# Patient Record
Sex: Male | Born: 1970 | Hispanic: Yes | Marital: Single | Smoking: Never smoker
Health system: Southern US, Community
[De-identification: ages and names within clinical notes are randomized; demographics above are authoritative.]

## PROBLEM LIST (undated history)

## (undated) DIAGNOSIS — R911 Solitary pulmonary nodule: Secondary | ICD-10-CM

## (undated) DIAGNOSIS — E785 Hyperlipidemia, unspecified: Secondary | ICD-10-CM

## (undated) HISTORY — DX: Hyperlipidemia, unspecified: E78.5

## (undated) HISTORY — DX: Solitary pulmonary nodule: R91.1

---

## 2016-08-21 DIAGNOSIS — H538 Other visual disturbances: Secondary | ICD-10-CM | POA: Insufficient documentation

## 2016-08-21 DIAGNOSIS — I10 Essential (primary) hypertension: Secondary | ICD-10-CM | POA: Insufficient documentation

## 2016-08-21 DIAGNOSIS — R748 Abnormal levels of other serum enzymes: Secondary | ICD-10-CM | POA: Insufficient documentation

## 2016-08-21 DIAGNOSIS — E119 Type 2 diabetes mellitus without complications: Secondary | ICD-10-CM | POA: Insufficient documentation

## 2016-08-21 DIAGNOSIS — E1169 Type 2 diabetes mellitus with other specified complication: Secondary | ICD-10-CM | POA: Insufficient documentation

## 2016-11-21 DIAGNOSIS — R809 Proteinuria, unspecified: Secondary | ICD-10-CM | POA: Insufficient documentation

## 2017-07-05 DIAGNOSIS — E8809 Other disorders of plasma-protein metabolism, not elsewhere classified: Secondary | ICD-10-CM | POA: Insufficient documentation

## 2017-12-27 DIAGNOSIS — D649 Anemia, unspecified: Secondary | ICD-10-CM | POA: Insufficient documentation

## 2018-03-16 DIAGNOSIS — L282 Other prurigo: Secondary | ICD-10-CM | POA: Insufficient documentation

## 2018-12-05 ENCOUNTER — Telehealth: Payer: Self-pay | Admitting: Internal Medicine

## 2018-12-05 ENCOUNTER — Encounter: Payer: Self-pay | Admitting: Internal Medicine

## 2019-11-20 ENCOUNTER — Emergency Department
Admission: EM | Admit: 2019-11-20 | Discharge: 2019-11-20 | Disposition: A | Discharge status: Home / Self Care | Payer: Self-pay | Attending: Emergency Medicine | Admitting: Emergency Medicine

## 2019-11-20 ENCOUNTER — Emergency Department: Payer: Self-pay

## 2019-11-20 ENCOUNTER — Encounter: Payer: Self-pay | Admitting: Radiology

## 2019-11-20 ENCOUNTER — Other Ambulatory Visit: Payer: Self-pay

## 2019-11-20 DIAGNOSIS — J029 Acute pharyngitis, unspecified: Secondary | ICD-10-CM | POA: Insufficient documentation

## 2019-11-20 DIAGNOSIS — R197 Diarrhea, unspecified: Secondary | ICD-10-CM | POA: Insufficient documentation

## 2019-11-20 DIAGNOSIS — R918 Other nonspecific abnormal finding of lung field: Secondary | ICD-10-CM | POA: Insufficient documentation

## 2019-11-20 DIAGNOSIS — R0602 Shortness of breath: Secondary | ICD-10-CM | POA: Insufficient documentation

## 2019-11-20 DIAGNOSIS — R066 Hiccough: Secondary | ICD-10-CM | POA: Insufficient documentation

## 2019-11-20 LAB — CBC WITH DIFFERENTIAL/PLATELET
Abs Immature Granulocytes: 0.24 10*3/uL — ABNORMAL HIGH (ref 0.00–0.07)
Basophils Absolute: 0 10*3/uL (ref 0.0–0.1)
Basophils Relative: 0 %
Eosinophils Absolute: 0 10*3/uL (ref 0.0–0.5)
Eosinophils Relative: 0 %
HCT: 30 % — ABNORMAL LOW (ref 39.0–52.0)
Hemoglobin: 9.8 g/dL — ABNORMAL LOW (ref 13.0–17.0)
Immature Granulocytes: 4 %
Lymphocytes Relative: 41 %
Lymphs Abs: 2.6 10*3/uL (ref 0.7–4.0)
MCH: 27.8 pg (ref 26.0–34.0)
MCHC: 32.7 g/dL (ref 30.0–36.0)
MCV: 85 fL (ref 80.0–100.0)
Monocytes Absolute: 0.9 10*3/uL (ref 0.1–1.0)
Monocytes Relative: 14 %
Neutro Abs: 2.6 10*3/uL (ref 1.7–7.7)
Neutrophils Relative %: 41 %
Platelets: 165 10*3/uL (ref 150–400)
RBC: 3.53 MIL/uL — ABNORMAL LOW (ref 4.22–5.81)
RDW: 14.3 % (ref 11.5–15.5)
WBC: 6.3 10*3/uL (ref 4.0–10.5)
nRBC: 0 % (ref 0.0–0.2)

## 2019-11-20 LAB — COMPREHENSIVE METABOLIC PANEL
ALT: 36 U/L (ref 0–44)
AST: 57 U/L — ABNORMAL HIGH (ref 15–41)
Albumin: 2.2 g/dL — ABNORMAL LOW (ref 3.5–5.0)
Alkaline Phosphatase: 331 U/L — ABNORMAL HIGH (ref 38–126)
Anion gap: 6 (ref 5–15)
BUN: 33 mg/dL — ABNORMAL HIGH (ref 6–20)
CO2: 26 mmol/L (ref 22–32)
Calcium: 7.6 mg/dL — ABNORMAL LOW (ref 8.9–10.3)
Chloride: 100 mmol/L (ref 98–111)
Creatinine, Ser: 1.42 mg/dL — ABNORMAL HIGH (ref 0.61–1.24)
GFR calc Af Amer: >60 mL/min (ref >60)
GFR calc non Af Amer: 57 mL/min — ABNORMAL LOW (ref >60)
Glucose, Bld: 121 mg/dL — ABNORMAL HIGH (ref 70–99)
Potassium: 3.7 mmol/L (ref 3.5–5.1)
Sodium: 132 mmol/L — ABNORMAL LOW (ref 135–145)
Total Bilirubin: 0.6 mg/dL (ref 0.3–1.2)
Total Protein: 9.5 g/dL — ABNORMAL HIGH (ref 6.5–8.1)

## 2019-11-20 MED ORDER — GABAPENTIN 100 MG PO CAPS
100.0000 mg | ORAL_CAPSULE | Freq: Three times a day (TID) | ORAL | 2 refills | Status: AC
Start: 2019-11-20 — End: 2020-11-19

## 2019-11-20 MED ORDER — IOHEXOL 300 MG/ML  SOLN
75.0000 mL | Freq: Once | INTRAMUSCULAR | Status: AC | PRN
  Administered 2019-11-20: 75 mL via INTRAVENOUS

## 2019-11-20 MED ORDER — PANTOPRAZOLE SODIUM 40 MG PO TBEC
40.0000 mg | DELAYED_RELEASE_TABLET | Freq: Every day | ORAL | 1 refills | Status: DC
Start: 2019-11-20 — End: 2019-12-13

## 2019-11-20 MED ORDER — SODIUM CHLORIDE 0.9 % IV BOLUS
1000.0000 mL | Freq: Once | INTRAVENOUS | Status: AC
  Administered 2019-11-20: 1000 mL via INTRAVENOUS

## 2019-11-20 NOTE — ED Notes (Signed)
Topez not working, Pt verbalized understanding discharge instructions with interpreter present.

## 2019-11-20 NOTE — Discharge Instructions (Addendum)
Please follow-up with your primary care doctor.  Have them review the CT scan results.  There are a lot of lymph nodes on the CT scan will have to investigate what is causing this.  I will give you some gabapentin 1 pill 3 times a day to help with the hiccups and some Protonix which should help with the burning in the throat.  Will also help with the reflux.  Please return here for fever vomiting or feeling sicker. Por favor visite a su clinica, y el Dr. Tommi Rumps la clinica tiene que ver las imagenes ( fotos) que muestran la inflamacion de sus ganglios linfaticos. Por favor tomese la medicina como prescrita ( Gabapetin 3 veces por dia, y Protonix para las agruras. Regrese por fiebre, vomito o se siente mal.

## 2019-11-20 NOTE — ED Triage Notes (Signed)
Information obtained via [facility] interpreter.  Reports a week with hiccups and acid reflux which makes him feel short of breath.

## 2019-11-20 NOTE — ED Notes (Signed)
Pt sitting in wheelchair in lobby in no acute distress.  

## 2019-11-20 NOTE — ED Provider Notes (Signed)
[facility] Emergency Department Provider Note   ____________________________________________   First MD Initiated Contact with Patient 11/20/19 0720     (approximate)  I have reviewed the triage vital signs and the nursing notes.   HISTORY  Chief Complaint No chief complaint on file.  Chief complaint is hiccups  HPI Manuel Hayes is a 49 y.o. male who reports he has been having hiccups intermittently since last Monday.  I be a week.  He is hiccuping so much that he is having some pain in his throat.  He is also coughing a little bit.  He has had diarrheal stools for 2 days now.  He is not running a fever.  His cough is nonproductive.  He has no other problems.  He is a diabetic and is seen at a clinic in [place] for this.  Sore throat is slightly burning.  Past medical history significant for diabetes for which he takes Metformin for reflux    Prior to Admission medications   Medication Sig Start Date End Date Taking? Authorizing Provider  gabapentin (NEURONTIN) 100 MG capsule Take 1 capsule (100 mg total) by mouth 3 (three) times daily. 11/20/19 11/19/20  Arnaldo Natal, MD  pantoprazole (PROTONIX) 40 MG tablet Take 1 tablet (40 mg total) by mouth daily. 11/20/19 11/19/20  Arnaldo Natal, MD    Allergies Patient has no known allergies.  History reviewed. No pertinent family history.  Social History Social History   Tobacco Use  . Smoking status: Not on file  Substance Use Topics  . Alcohol use: Not on file  . Drug use: Not on file    Review of Systems  Constitutional: No fever/chills Eyes: No visual changes. ENT: No sore throat. Cardiovascular: Denies chest pain. Respiratory: Denies shortness of breath. Gastrointestinal: No abdominal pain.  No nausea, no vomiting.  Mild diarrhea.  No constipation. Genitourinary: Negative for dysuria. Musculoskeletal: Negative for back pain. Skin: Negative for rash. Neurological: Negative for  headaches, focal weakness   ____________________________________________   PHYSICAL EXAM:  VITAL SIGNS: ED Triage Vitals  Enc Vitals Group     BP 11/20/19 0241 132/77     Pulse Rate 11/20/19 0241 (!) 107     Resp 11/20/19 0241 20     Temp 11/20/19 0241 99.7 F (37.6 C)     Temp Source 11/20/19 0241 Oral     SpO2 11/20/19 0241 100 %     Weight 11/20/19 0238 119 lb (54 kg)     Height 11/20/19 0238 5' (1.524 m)     Head Circumference --      Peak Flow --      Pain Score 11/20/19 0619 8     Pain Loc --      Pain Edu? --      Excl. in GC? --     Constitutional: Alert and oriented. Well appearing and in no acute distress. Eyes: Conjunctivae are normal.  Head: Atraumatic. Nose: No congestion/rhinnorhea. Mouth/Throat: Mucous membranes are moist.  Oropharynx non-erythematous. Neck: No stridor. Cardiovascular: Normal rate, regular rhythm. Grossly normal heart sounds.  Good peripheral circulation. Respiratory: Normal respiratory effort.  No retractions. Lungs CTAB. Gastrointestinal: Soft and nontender. No distention. No abdominal bruits. Musculoskeletal: No lower extremity tenderness nor edema.   Neurologic:  Normal speech and language. No gross focal neurologic deficits are appreciated.  Skin:  Skin is warm, dry and intact. No rash noted.   ____________________________________________   LABS (all labs ordered are listed, but only  abnormal results are displayed)  Labs Reviewed  CBC WITH DIFFERENTIAL/PLATELET - Abnormal; Notable for the following components:      Result Value   RBC 3.53 (*)    Hemoglobin 9.8 (*)    HCT 30.0 (*)    Abs Immature Granulocytes 0.24 (*)    All other components within normal limits  COMPREHENSIVE METABOLIC PANEL - Abnormal; Notable for the following components:   Sodium 132 (*)    Glucose, Bld 121 (*)    BUN 33 (*)    Creatinine, Ser 1.42 (*)    Calcium 7.6 (*)    Total Protein 9.5 (*)    Albumin 2.2 (*)    AST 57 (*)    Alkaline  Phosphatase 331 (*)    GFR calc non Af Amer 57 (*)    All other components within normal limits   ____________________________________________  EKG   ____________________________________________  RADIOLOGY  ED MD interpretation: Chest x-ray read by radiology as having a spot on the lung on the left base which is not seen on the lateral view CT scan of the chest shows multiple apparent pulmonary nodules and lymph nodes.  I reviewed the films Official radiology report(s): DG Chest 2 View  Result Date: 11/20/2019 CLINICAL DATA:  49 year old male with history of cough. EXAM: CHEST - 2 VIEW COMPARISON:  Chest x-ray 11/20/2019. FINDINGS: Nodular density projecting over the left lung base seen only on the frontal projection. Prominent right-sided nipple shadow incidentally noted. Lung volumes are normal. No consolidative airspace disease. No pleural effusions. No pneumothorax. Pulmonary vasculature and the cardiomediastinal silhouette are within normal limits. IMPRESSION: 1.  No radiographic evidence of acute cardiopulmonary disease. 2. Nodular density projecting over the left lung base noted only on the frontal projection. Repeat standing PA and lateral chest radiograph is recommended in 1-2 months to ensure the stability or resolution of this finding. Should this finding persists, further evaluation with chest CT would be recommended. Electronically Signed   By: Trudie Reedaniel  Entrikin M.D.   On: 11/20/2019 06:38   CT Chest W Contrast  Result Date: 11/20/2019 CLINICAL DATA:  49 year old male with history of elevated alkaline phosphatase. Shortness of breath. Hiccups. EXAM: CT CHEST, ABDOMEN, AND PELVIS WITH CONTRAST TECHNIQUE: Multidetector CT imaging of the chest, abdomen and pelvis was performed following the standard protocol during bolus administration of intravenous contrast. CONTRAST:  75mL OMNIPAQUE IOHEXOL 300 MG/ML  SOLN COMPARISON:  No priors. FINDINGS: CT CHEST FINDINGS Cardiovascular: Heart size  is normal. There is no significant pericardial fluid, thickening or pericardial calcification. Aortic atherosclerosis. No definite coronary artery calcifications. Mediastinum/Nodes: Multiple prominent borderline enlarged mediastinal and bilateral hilar lymph nodes are noted, nonspecific. Esophagus is unremarkable in appearance. Multiple borderline enlarged left axillary lymph nodes also noted measuring up to 8 mm in short axis. Lungs/Pleura: Multiple pulmonary nodules are noted throughout the lungs bilaterally, largest of which is in the inferior segment of the lingula measuring 2.1 x 0.8 cm (axial image 103 of series 4). In addition, there are innumerable tiny nodules noted in a subpleural distribution associated with the major and minor fissures. No acute consolidative airspace disease. No pleural effusions. Musculoskeletal: There are no aggressive appearing lytic or blastic lesions noted in the visualized portions of the skeleton. CT ABDOMEN PELVIS FINDINGS Hepatobiliary: No suspicious cystic or solid hepatic lesions. No intra or extrahepatic biliary ductal dilatation. Gallbladder is normal in appearance. Pancreas: No pancreatic mass. No pancreatic ductal dilatation. No pancreatic or peripancreatic fluid collections or inflammatory changes. Spleen: Unremarkable.  Adrenals/Urinary Tract: Bilateral kidneys and bilateral adrenal glands are normal in appearance. No hydroureteronephrosis. Urinary bladder is normal in appearance. Stomach/Bowel: Normal appearance of the stomach. No pathologic dilatation of small bowel or colon. Normal appendix. Vascular/Lymphatic: Aortic atherosclerosis, without evidence of aneurysm or dissection in the abdominal or pelvic vasculature. No pathologically enlarged abdominal or pelvic lymph nodes are noted. Borderline enlarged right common femoral lymph node incidentally noted measuring 9 mm in short axis. Reproductive: Prostate gland and seminal vesicles are unremarkable in appearance.  Other: No significant volume of ascites.  No pneumoperitoneum. Musculoskeletal: Well-defined sclerotic lesion with narrow zone of transition in the medial aspect of the left ilium adjacent to the sacroiliac joint measuring 1.3 x 0.8 cm, most compatible with a bone island. There are no aggressive appearing lytic or blastic lesions noted in the visualized portions of the skeleton. IMPRESSION: 1. No acute findings are noted in the chest, abdomen or pelvis to account for the patient's symptoms. 2. However, there are innumerable by borderline enlarged mediastinal, bilateral hilar and left axillary lymph nodes. This is nonspecific. However, in light of the widespread subpleural nodularity in the lungs, clinical correlation for signs and symptoms of systemic disease such as sarcoidosis is recommended. 3. There also other larger pulmonary nodules in the lungs bilaterally measuring up to 2.1 x 0.8 cm in the lingula. If the patient does have sarcoidosis, these could be related. However, close attention on follow-up studies is recommended. Non-contrast chest CT at 3-6 months is recommended. If the nodules are stable at time of repeat CT, then future CT at 18-24 months (from today's scan) is considered optional for low-risk patients, but is recommended for high-risk patients. This recommendation follows the consensus statement: Guidelines for Management of Incidental Pulmonary Nodules Detected on CT Images: From the Fleischner Society 2017; Radiology 2017; 284:228-243. 4. Additional incidental findings, as above. Electronically Signed   By: Trudie Reed M.D.   On: 11/20/2019 09:30   CT ABDOMEN PELVIS W CONTRAST  Result Date: 11/20/2019 CLINICAL DATA:  49 year old male with history of elevated alkaline phosphatase. Shortness of breath. Hiccups. EXAM: CT CHEST, ABDOMEN, AND PELVIS WITH CONTRAST TECHNIQUE: Multidetector CT imaging of the chest, abdomen and pelvis was performed following the standard protocol during bolus  administration of intravenous contrast. CONTRAST:  55mL OMNIPAQUE IOHEXOL 300 MG/ML  SOLN COMPARISON:  No priors. FINDINGS: CT CHEST FINDINGS Cardiovascular: Heart size is normal. There is no significant pericardial fluid, thickening or pericardial calcification. Aortic atherosclerosis. No definite coronary artery calcifications. Mediastinum/Nodes: Multiple prominent borderline enlarged mediastinal and bilateral hilar lymph nodes are noted, nonspecific. Esophagus is unremarkable in appearance. Multiple borderline enlarged left axillary lymph nodes also noted measuring up to 8 mm in short axis. Lungs/Pleura: Multiple pulmonary nodules are noted throughout the lungs bilaterally, largest of which is in the inferior segment of the lingula measuring 2.1 x 0.8 cm (axial image 103 of series 4). In addition, there are innumerable tiny nodules noted in a subpleural distribution associated with the major and minor fissures. No acute consolidative airspace disease. No pleural effusions. Musculoskeletal: There are no aggressive appearing lytic or blastic lesions noted in the visualized portions of the skeleton. CT ABDOMEN PELVIS FINDINGS Hepatobiliary: No suspicious cystic or solid hepatic lesions. No intra or extrahepatic biliary ductal dilatation. Gallbladder is normal in appearance. Pancreas: No pancreatic mass. No pancreatic ductal dilatation. No pancreatic or peripancreatic fluid collections or inflammatory changes. Spleen: Unremarkable. Adrenals/Urinary Tract: Bilateral kidneys and bilateral adrenal glands are normal in appearance. No hydroureteronephrosis. Urinary bladder  is normal in appearance. Stomach/Bowel: Normal appearance of the stomach. No pathologic dilatation of small bowel or colon. Normal appendix. Vascular/Lymphatic: Aortic atherosclerosis, without evidence of aneurysm or dissection in the abdominal or pelvic vasculature. No pathologically enlarged abdominal or pelvic lymph nodes are noted. Borderline  enlarged right common femoral lymph node incidentally noted measuring 9 mm in short axis. Reproductive: Prostate gland and seminal vesicles are unremarkable in appearance. Other: No significant volume of ascites.  No pneumoperitoneum. Musculoskeletal: Well-defined sclerotic lesion with narrow zone of transition in the medial aspect of the left ilium adjacent to the sacroiliac joint measuring 1.3 x 0.8 cm, most compatible with a bone island. There are no aggressive appearing lytic or blastic lesions noted in the visualized portions of the skeleton. IMPRESSION: 1. No acute findings are noted in the chest, abdomen or pelvis to account for the patient's symptoms. 2. However, there are innumerable by borderline enlarged mediastinal, bilateral hilar and left axillary lymph nodes. This is nonspecific. However, in light of the widespread subpleural nodularity in the lungs, clinical correlation for signs and symptoms of systemic disease such as sarcoidosis is recommended. 3. There also other larger pulmonary nodules in the lungs bilaterally measuring up to 2.1 x 0.8 cm in the lingula. If the patient does have sarcoidosis, these could be related. However, close attention on follow-up studies is recommended. Non-contrast chest CT at 3-6 months is recommended. If the nodules are stable at time of repeat CT, then future CT at 18-24 months (from today's scan) is considered optional for low-risk patients, but is recommended for high-risk patients. This recommendation follows the consensus statement: Guidelines for Management of Incidental Pulmonary Nodules Detected on CT Images: From the Fleischner Society 2017; Radiology 2017; 284:228-243. 4. Additional incidental findings, as above. Electronically Signed   By: Trudie Reed M.D.   On: 11/20/2019 09:30    ____________________________________________   PROCEDURES  Procedure(s) performed (including Critical  Care):  Procedures   ____________________________________________   INITIAL IMPRESSION / ASSESSMENT AND PLAN / ED COURSE  Patient has a spot on his lung on the AP view only.  Additionally he has an elevated alkaline phosphatase and minimally elevated AST.  His protein slightly up and his calcium is down.    Chest CT shows multiple pulmonary nodules and lymph nodes.  Radiologist is concerned for sarcoid.  I will have patient follow-up with his primary care doctor to arrange for the follow-up and evaluation.  I will try gabapentin for his hiccups as is recommended by up-to-date.          ____________________________________________   FINAL CLINICAL IMPRESSION(S) / ED DIAGNOSES  Final diagnoses:  Intractable hiccups  Lung nodule, multiple     ED Discharge Orders         Ordered    pantoprazole (PROTONIX) 40 MG tablet  Daily     Discontinue  Reprint     11/20/19 1123    gabapentin (NEURONTIN) 100 MG capsule  3 times daily     Discontinue  Reprint     11/20/19 1123           Note:  This document was prepared using Dragon voice recognition software and may include unintentional dictation errors.    Arnaldo Natal, MD 11/20/19 [phone]

## 2019-11-20 NOTE — ED Notes (Signed)
(  via interpretor) Pt indicates pain in his neck/throat/upper chest region.  He states he has been hiccupping, and yesterday around noon it became so bad that he was having some trouble breathing.  Currently his pain has decreased to 8/10.  Pt denies breathing issues at this time and states the pain has "calmed down".

## 2019-12-13 ENCOUNTER — Other Ambulatory Visit: Payer: Self-pay

## 2019-12-13 ENCOUNTER — Encounter: Payer: Self-pay | Admitting: Pulmonary Disease

## 2019-12-13 ENCOUNTER — Ambulatory Visit (INDEPENDENT_AMBULATORY_CARE_PROVIDER_SITE_OTHER): Payer: Self-pay | Admitting: Pulmonary Disease

## 2019-12-13 VITALS — BP 118/58 | HR 112 | Temp 98.4°F | Ht 62.0 in | Wt 121.0 lb

## 2019-12-13 DIAGNOSIS — R918 Other nonspecific abnormal finding of lung field: Secondary | ICD-10-CM

## 2019-12-13 DIAGNOSIS — D6489 Other specified anemias: Secondary | ICD-10-CM

## 2019-12-13 DIAGNOSIS — R748 Abnormal levels of other serum enzymes: Secondary | ICD-10-CM

## 2019-12-13 MED ORDER — PANTOPRAZOLE SODIUM 40 MG PO TBEC: 40.0000 mg | DELAYED_RELEASE_TABLET | Freq: Every day | ORAL | 1 refills | Status: DC

## 2019-12-13 MED ORDER — OMEPRAZOLE 20 MG PO CPDR: 20.0000 mg | DELAYED_RELEASE_CAPSULE | Freq: Every day | ORAL | 11 refills | Status: DC

## 2019-12-13 NOTE — Progress Notes (Signed)
Patient ID: Manuel Hayes, male    DOB: 15-Jul-1970, 49 y.o.   MRN: [id number]  Chief Complaint  Patient presents with  . Hospitalization Follow-up    lung nodule - hiccups 2 weeks - cough productive a little thick white     Referring provider: Karel Jarvis, NP  HPI:  Manuel Hayes is a 49 year old with DM whom we are seeing in consultation at the request of Michail Jewels NP for evaluation of lung nodules.  Patient had hiccups for 4 days without any relief. This prompted ED visit where CT chest with my interpretation as bilateral primarily upper lobe nodules with largest 2 cm in lingula and bilateral hilar and mediastinal lymph nodes seen. He reports cough present the last few months. Clear phlegm. No clear triggers or alleviating factors. Not worse at night. No clear timing or pattern during the day. Associated with sore throat when he coughs. No DOE or SOB or chest pain.  No recent weight loss. Has lost 25 pounds over last several years after diagnosis of DM. No night sweats or fever. No headaches or muscle weakness.  He is a never smoker. No history of lung cancer in the family.  Review of PCP note and labs drawn in 08/2019 with anemia Hgb 11s and Cr 1.02. Noted to have hypoalbuminemia and elevated total protein. Labs 11/2019 with Cr 1.4, hgb 9.8, total protein 9.5, albumin 2.2.   PMH: DM, HTN Past surgical History: History reviewed. No pertinent surgical history.  Family History:History reviewed. No pertinent family history.  Social History: Lives in [place], never smoker, works as Curator  Imaging: Personally Reviewed DG Chest 2 View  Result Date: 11/20/2019 CLINICAL DATA:  49 year old male with history of cough. EXAM: CHEST - 2 VIEW COMPARISON:  Chest x-ray 11/20/2019. FINDINGS: Nodular density projecting over the left lung base seen only on the frontal projection. Prominent right-sided nipple shadow incidentally noted. Lung volumes are normal. No  consolidative airspace disease. No pleural effusions. No pneumothorax. Pulmonary vasculature and the cardiomediastinal silhouette are within normal limits. IMPRESSION: 1.  No radiographic evidence of acute cardiopulmonary disease. 2. Nodular density projecting over the left lung base noted only on the frontal projection. Repeat standing PA and lateral chest radiograph is recommended in 1-2 months to ensure the stability or resolution of this finding. Should this finding persists, further evaluation with chest CT would be recommended. Electronically Signed   By: Vinnie Langton M.D.   On: 11/20/2019 06:38   CT Chest W Contrast  Result Date: 11/20/2019 CLINICAL DATA:  49 year old male with history of elevated alkaline phosphatase. Shortness of breath. Hiccups. EXAM: CT CHEST, ABDOMEN, AND PELVIS WITH CONTRAST TECHNIQUE: Multidetector CT imaging of the chest, abdomen and pelvis was performed following the standard protocol during bolus administration of intravenous contrast. CONTRAST:  35m OMNIPAQUE IOHEXOL 300 MG/ML  SOLN COMPARISON:  No priors. FINDINGS: CT CHEST FINDINGS Cardiovascular: Heart size is normal. There is no significant pericardial fluid, thickening or pericardial calcification. Aortic atherosclerosis. No definite coronary artery calcifications. Mediastinum/Nodes: Multiple prominent borderline enlarged mediastinal and bilateral hilar lymph nodes are noted, nonspecific. Esophagus is unremarkable in appearance. Multiple borderline enlarged left axillary lymph nodes also noted measuring up to 8 mm in short axis. Lungs/Pleura: Multiple pulmonary nodules are noted throughout the lungs bilaterally, largest of which is in the inferior segment of the lingula measuring 2.1 x 0.8 cm (axial image 103 of series 4). In addition, there are innumerable tiny nodules noted in a  subpleural distribution associated with the major and minor fissures. No acute consolidative airspace disease. No pleural effusions.  Musculoskeletal: There are no aggressive appearing lytic or blastic lesions noted in the visualized portions of the skeleton. CT ABDOMEN PELVIS FINDINGS Hepatobiliary: No suspicious cystic or solid hepatic lesions. No intra or extrahepatic biliary ductal dilatation. Gallbladder is normal in appearance. Pancreas: No pancreatic mass. No pancreatic ductal dilatation. No pancreatic or peripancreatic fluid collections or inflammatory changes. Spleen: Unremarkable. Adrenals/Urinary Tract: Bilateral kidneys and bilateral adrenal glands are normal in appearance. No hydroureteronephrosis. Urinary bladder is normal in appearance. Stomach/Bowel: Normal appearance of the stomach. No pathologic dilatation of small bowel or colon. Normal appendix. Vascular/Lymphatic: Aortic atherosclerosis, without evidence of aneurysm or dissection in the abdominal or pelvic vasculature. No pathologically enlarged abdominal or pelvic lymph nodes are noted. Borderline enlarged right common femoral lymph node incidentally noted measuring 9 mm in short axis. Reproductive: Prostate gland and seminal vesicles are unremarkable in appearance. Other: No significant volume of ascites.  No pneumoperitoneum. Musculoskeletal: Well-defined sclerotic lesion with narrow zone of transition in the medial aspect of the left ilium adjacent to the sacroiliac joint measuring 1.3 x 0.8 cm, most compatible with a bone island. There are no aggressive appearing lytic or blastic lesions noted in the visualized portions of the skeleton. IMPRESSION: 1. No acute findings are noted in the chest, abdomen or pelvis to account for the patient's symptoms. 2. However, there are innumerable by borderline enlarged mediastinal, bilateral hilar and left axillary lymph nodes. This is nonspecific. However, in light of the widespread subpleural nodularity in the lungs, clinical correlation for signs and symptoms of systemic disease such as sarcoidosis is recommended. 3. There also other  larger pulmonary nodules in the lungs bilaterally measuring up to 2.1 x 0.8 cm in the lingula. If the patient does have sarcoidosis, these could be related. However, close attention on follow-up studies is recommended. Non-contrast chest CT at 3-6 months is recommended. If the nodules are stable at time of repeat CT, then future CT at 18-24 months (from today's scan) is considered optional for low-risk patients, but is recommended for high-risk patients. This recommendation follows the consensus statement: Guidelines for Management of Incidental Pulmonary Nodules Detected on CT Images: From the Fleischner Society 2017; Radiology 2017; 284:228-243. 4. Additional incidental findings, as above. Electronically Signed   By: Vinnie Langton M.D.   On: 11/20/2019 09:30   CT ABDOMEN PELVIS W CONTRAST  Result Date: 11/20/2019 CLINICAL DATA:  49 year old male with history of elevated alkaline phosphatase. Shortness of breath. Hiccups. EXAM: CT CHEST, ABDOMEN, AND PELVIS WITH CONTRAST TECHNIQUE: Multidetector CT imaging of the chest, abdomen and pelvis was performed following the standard protocol during bolus administration of intravenous contrast. CONTRAST:  4m OMNIPAQUE IOHEXOL 300 MG/ML  SOLN COMPARISON:  No priors. FINDINGS: CT CHEST FINDINGS Cardiovascular: Heart size is normal. There is no significant pericardial fluid, thickening or pericardial calcification. Aortic atherosclerosis. No definite coronary artery calcifications. Mediastinum/Nodes: Multiple prominent borderline enlarged mediastinal and bilateral hilar lymph nodes are noted, nonspecific. Esophagus is unremarkable in appearance. Multiple borderline enlarged left axillary lymph nodes also noted measuring up to 8 mm in short axis. Lungs/Pleura: Multiple pulmonary nodules are noted throughout the lungs bilaterally, largest of which is in the inferior segment of the lingula measuring 2.1 x 0.8 cm (axial image 103 of series 4). In addition, there are  innumerable tiny nodules noted in a subpleural distribution associated with the major and minor fissures. No acute consolidative airspace disease. No  pleural effusions. Musculoskeletal: There are no aggressive appearing lytic or blastic lesions noted in the visualized portions of the skeleton. CT ABDOMEN PELVIS FINDINGS Hepatobiliary: No suspicious cystic or solid hepatic lesions. No intra or extrahepatic biliary ductal dilatation. Gallbladder is normal in appearance. Pancreas: No pancreatic mass. No pancreatic ductal dilatation. No pancreatic or peripancreatic fluid collections or inflammatory changes. Spleen: Unremarkable. Adrenals/Urinary Tract: Bilateral kidneys and bilateral adrenal glands are normal in appearance. No hydroureteronephrosis. Urinary bladder is normal in appearance. Stomach/Bowel: Normal appearance of the stomach. No pathologic dilatation of small bowel or colon. Normal appendix. Vascular/Lymphatic: Aortic atherosclerosis, without evidence of aneurysm or dissection in the abdominal or pelvic vasculature. No pathologically enlarged abdominal or pelvic lymph nodes are noted. Borderline enlarged right common femoral lymph node incidentally noted measuring 9 mm in short axis. Reproductive: Prostate gland and seminal vesicles are unremarkable in appearance. Other: No significant volume of ascites.  No pneumoperitoneum. Musculoskeletal: Well-defined sclerotic lesion with narrow zone of transition in the medial aspect of the left ilium adjacent to the sacroiliac joint measuring 1.3 x 0.8 cm, most compatible with a bone island. There are no aggressive appearing lytic or blastic lesions noted in the visualized portions of the skeleton. IMPRESSION: 1. No acute findings are noted in the chest, abdomen or pelvis to account for the patient's symptoms. 2. However, there are innumerable by borderline enlarged mediastinal, bilateral hilar and left axillary lymph nodes. This is nonspecific. However, in light of  the widespread subpleural nodularity in the lungs, clinical correlation for signs and symptoms of systemic disease such as sarcoidosis is recommended. 3. There also other larger pulmonary nodules in the lungs bilaterally measuring up to 2.1 x 0.8 cm in the lingula. If the patient does have sarcoidosis, these could be related. However, close attention on follow-up studies is recommended. Non-contrast chest CT at 3-6 months is recommended. If the nodules are stable at time of repeat CT, then future CT at 18-24 months (from today's scan) is considered optional for low-risk patients, but is recommended for high-risk patients. This recommendation follows the consensus statement: Guidelines for Management of Incidental Pulmonary Nodules Detected on CT Images: From the Fleischner Society 2017; Radiology 2017; 284:228-243. 4. Additional incidental findings, as above. Electronically Signed   By: Vinnie Langton M.D.   On: 11/20/2019 09:30    Lab Results: Personally Reviewed, pertinent values noted in HPI CBC    Component Value Date/Time   WBC 6.3 11/20/2019 0246   RBC 3.53 (L) 11/20/2019 0246   HGB 9.8 (L) 11/20/2019 0246   HCT 30.0 (L) 11/20/2019 0246   PLT 165 11/20/2019 0246   MCV 85.0 11/20/2019 0246   MCH 27.8 11/20/2019 0246   MCHC 32.7 11/20/2019 0246   RDW 14.3 11/20/2019 0246   LYMPHSABS 2.6 11/20/2019 0246   MONOABS 0.9 11/20/2019 0246   EOSABS 0.0 11/20/2019 0246   BASOSABS 0.0 11/20/2019 0246    BMET    Component Value Date/Time   NA 132 (L) 11/20/2019 0246   K 3.7 11/20/2019 0246   CL 100 11/20/2019 0246   CO2 26 11/20/2019 0246   GLUCOSE 121 (H) 11/20/2019 0246   BUN 33 (H) 11/20/2019 0246   CREATININE 1.42 (H) 11/20/2019 0246   CALCIUM 7.6 (L) 11/20/2019 0246   GFRNONAA 57 (L) 11/20/2019 0246   GFRAA >60 11/20/2019 0246    BNP No results found for: BNP  ProBNP No results found for: PROBNP   No Known Allergies   There is no immunization history on  file for this  patient.  Past Medical History:  Diagnosis Date  . Hyperlipidemia   . Pulmonary nodule     Tobacco History: Social History   Tobacco Use  Smoking Status Never Smoker  Smokeless Tobacco Never Used   Counseling given: Not Answered   Outpatient Encounter Medications as of 12/13/2019  Medication Sig  . gabapentin (NEURONTIN) 100 MG capsule Take 1 capsule (100 mg total) by mouth 3 (three) times daily.  [name] Kitchen glipiZIDE (GLUCOTROL XL) 2.5 MG 24 hr tablet glipizide ER 2.5 mg tablet, extended release 24 hr  TAKE ONE TABLET BY MOUTH EVERY DAY  . levothyroxine (SYNTHROID) 25 MCG tablet TOMA UNA TABLETA POR LA BOCA DIARIA ANTES DEL DESAYUNO  . lisinopril (ZESTRIL) 5 MG tablet lisinopril 5 mg tablet  TAKE ONE TABLET BY MOUTH EVERY DAY  . lovastatin (MEVACOR) 20 MG tablet lovastatin 20 mg tablet  TAKE ONE TABLET BY MOUTH AT BEDTIME  . metFORMIN (GLUCOPHAGE) 1000 MG tablet Take 1,000 mg by mouth 2 (two) times daily.  . pantoprazole (PROTONIX) 40 MG tablet Take 1 tablet (40 mg total) by mouth daily. (Patient not taking: Reported on 12/13/2019)   No facility-administered encounter medications on file as of 12/13/2019.     Review of Systems  Review of Systems  Scant hemoptysis few weeks ago, none recent. Frequent reflux symptoms. Comprehensive revise of systems otherwise negative  Physical Exam  BP (!) 118/58 (BP Location: Right Arm, Cuff Size: Normal)   Pulse (!) 112   Temp 98.4 F (36.9 C) (Oral)   Ht 5' 2" (1.575 m)   Wt 121 lb (54.9 kg)   SpO2 97%   BMI 22.13 kg/m   Wt Readings from Last 5 Encounters:  12/13/19 121 lb (54.9 kg)  11/20/19 119 lb (54 kg)    BMI Readings from Last 5 Encounters:  12/13/19 22.13 kg/m  11/20/19 23.24 kg/m     Physical Exam General: Thin, in NAD Eyes: EOMI, no icterus Mouth: MMM, lips CDI Respiratory: Mild expiratory crackles R mid lung field, otherwise CTAB, NWOB on RA Cardiovascular: RRR, no murmurs Abdomen: soft, NT, BS present Skin: No  rash, no lesions, warm, dry Neuro: normal gait, CN intact Psych: normal mood, full affect   Assessment & Plan:   Manuel Hayes is a 49 year old with DM whom we are seeing in consultation at the request of Michail Jewels NP for evaluation of lung nodules.  Multiple bilateral upper lobe predominant lung nodules. Differential includes TB, malignancy, vasculitis, sarcoid, and other non-infectious inflammatory conditions. No identifiable risk factors for lung cancer and pattern of nodules with the appearance of LAD would not be typical. Bilateral LAD favors sarcoid over vasculitis. Elevated LFTs could reflect sarcoid involvement. 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 Will obtain serologic testing for vasculitis, inflammatory conditions, and sarcoid today. Will await results. Assuming quant gold is negative and vasculitis serologies ar negative, will move forward with EBUS TBNA to obtain pathologic diagnosis.  He has chronic cough. Could be related to sarcoidosis considered above. Significant GERD possibly contributing. Encouraged take previously prescribed pantoprazole daily and assess cough's response to this therapy.   Finally, review of labs show worsening anemia, rising Cr, and significant protein gap. SPEP obtained to evaluate multiple myeloma.    Return in about 3 months (around 03/14/2020).   Lanier Clam, MD 12/13/2019

## 2019-12-13 NOTE — Patient Instructions (Addendum)
Nice to meet you!  We will get a bunch of labs today to further evaluate the spots in your lungs.   Based on the results, we will either schedule a bronchoscopy with biopsy or repeat a CT scan in 3 months to see if the spots change.  I will call you to discuss in the next couple of weeks.  Take omeprazole 20 mg once daily for reflux. This may help your cough.  Return to clinic in 3 months with Dr. Judeth Horn with PFTs

## 2019-12-14 ENCOUNTER — Other Ambulatory Visit (INDEPENDENT_AMBULATORY_CARE_PROVIDER_SITE_OTHER): Payer: Self-pay

## 2019-12-14 DIAGNOSIS — R918 Other nonspecific abnormal finding of lung field: Secondary | ICD-10-CM

## 2019-12-14 DIAGNOSIS — D6489 Other specified anemias: Secondary | ICD-10-CM

## 2019-12-14 LAB — URINALYSIS, ROUTINE W REFLEX MICROSCOPIC
Bilirubin Urine: NEGATIVE
Ketones, ur: NEGATIVE
Leukocytes,Ua: NEGATIVE
Nitrite: NEGATIVE
Specific Gravity, Urine: 1.01 (ref 1.000–1.030)
Total Protein, Urine: 30 — AB
Urine Glucose: NEGATIVE
Urobilinogen, UA: 0.2 (ref 0.0–1.0)
WBC, UA: NONE SEEN (ref 0–?)
pH: 7.5 (ref 5.0–8.0)

## 2019-12-14 LAB — C-REACTIVE PROTEIN: CRP: 1.1 mg/dL (ref 0.5–20.0)

## 2019-12-14 LAB — SEDIMENTATION RATE: Sed Rate: 96 mm/hr — ABNORMAL HIGH (ref 0–20)

## 2019-12-14 LAB — VITAMIN D 25 HYDROXY (VIT D DEFICIENCY, FRACTURES): VITD: 15.62 ng/mL — ABNORMAL LOW (ref 30.00–100.00)

## 2019-12-16 LAB — ANA+ENA+DNA/DS+ANTICH+CENTR
ANA Titer 1: NEGATIVE
Anti JO-1: <0.2 AI (ref 0.0–0.9)
Centromere Ab Screen: <0.2 AI (ref 0.0–0.9)
Chromatin Ab SerPl-aCnc: <0.2 AI (ref 0.0–0.9)
ENA RNP Ab: 0.4 AI (ref 0.0–0.9)
ENA SM Ab Ser-aCnc: <0.2 AI (ref 0.0–0.9)
ENA SSA (RO) Ab: <0.2 AI (ref 0.0–0.9)
ENA SSB (LA) Ab: <0.2 AI (ref 0.0–0.9)
Scleroderma (Scl-70) (ENA) Antibody, IgG: <0.2 AI (ref 0.0–0.9)
dsDNA Ab: <1 IU/mL (ref 0–9)

## 2019-12-18 ENCOUNTER — Telehealth: Payer: Self-pay | Admitting: Pulmonary Disease

## 2019-12-18 NOTE — Telephone Encounter (Signed)
Called patient to relay results of tests. 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 Sed rate high at 96. But more concerning was a positive quantiferon gold test. 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 This was shared with the patient and informed him I would contact the health department which he understood.  Spoke with [facility] Department and will provide test results, imaging reports, consult note, and demographics so they can contact patient for ongoing care.

## 2019-12-18 NOTE — Telephone Encounter (Signed)
Pt demographics, office note, lab, CXR and CT results were faxed to [facility]. Nothing further needed at this time.

## 2019-12-19 LAB — QUANTIFERON-TB GOLD PLUS
Mitogen-NIL: 8.98 IU/mL
NIL: 0.24 IU/mL
QuantiFERON-TB Gold Plus: POSITIVE — AB
TB1-NIL: 0.54 IU/mL
TB2-NIL: 0.6 IU/mL

## 2019-12-19 LAB — RHEUMATOID FACTOR: Rheumatoid fact SerPl-aCnc: <14 IU/mL (ref <14)

## 2019-12-19 LAB — GLOMERULAR BASEMENT MEMBRANE ANTIBODIES: GBM Ab: <1 AI

## 2019-12-19 LAB — ANCA SCREEN W REFLEX TITER
ANCA Screen: POSITIVE — AB
C-ANCA Titer: 1:20 {titer} — ABNORMAL HIGH

## 2019-12-19 LAB — VITAMIN D 1,25 DIHYDROXY
Vitamin D 1, 25 (OH)2 Total: 26 pg/mL (ref 18–72)
Vitamin D2 1, 25 (OH)2: <8 pg/mL
Vitamin D3 1, 25 (OH)2: 26 pg/mL

## 2019-12-19 LAB — CYCLIC CITRUL PEPTIDE ANTIBODY, IGG: Cyclic Citrullin Peptide Ab: <16 UNITS

## 2019-12-19 LAB — PROTEIN ELECTROPHORESIS, SERUM
Albumin ELP: 2.5 g/dL — ABNORMAL LOW (ref 3.8–4.8)
Alpha 1: 0.3 g/dL (ref 0.2–0.3)
Alpha 2: 0.8 g/dL (ref 0.5–0.9)
Beta 2: 0.7 g/dL — ABNORMAL HIGH (ref 0.2–0.5)
Beta Globulin: 0.5 g/dL (ref 0.4–0.6)
Gamma Globulin: 5.1 g/dL — ABNORMAL HIGH (ref 0.8–1.7)
Total Protein: 9.9 g/dL — ABNORMAL HIGH (ref 6.1–8.1)

## 2019-12-19 LAB — ANGIOTENSIN CONVERTING ENZYME: Angiotensin-Converting Enzyme: <5 U/L — ABNORMAL LOW (ref 9–67)

## 2019-12-28 ENCOUNTER — Telehealth: Payer: Self-pay | Admitting: Pulmonary Disease

## 2019-12-28 NOTE — Telephone Encounter (Signed)
[facility] calling to report sputum smear is negative for TB. Patient has been taken off isolation. FYI to MD Hunsucker. Patient has PFT and OV on 01/04/2020.

## 2020-01-01 ENCOUNTER — Other Ambulatory Visit (HOSPITAL_COMMUNITY)
Admission: RE | Admit: 2020-01-01 | Discharge: 2020-01-01 | Disposition: A | Discharge status: Home / Self Care | Payer: HRSA Program | Source: Ambulatory Visit | Attending: Adult Health | Admitting: Adult Health

## 2020-01-01 DIAGNOSIS — Z20822 Contact with and (suspected) exposure to covid-19: Secondary | ICD-10-CM | POA: Insufficient documentation

## 2020-01-01 DIAGNOSIS — Z01812 Encounter for preprocedural laboratory examination: Secondary | ICD-10-CM | POA: Insufficient documentation

## 2020-01-01 LAB — SARS CORONAVIRUS 2 (TAT 6-24 HRS): SARS Coronavirus 2: NEGATIVE

## 2020-01-04 ENCOUNTER — Ambulatory Visit (INDEPENDENT_AMBULATORY_CARE_PROVIDER_SITE_OTHER): Payer: Self-pay | Admitting: Adult Health

## 2020-01-04 ENCOUNTER — Ambulatory Visit (INDEPENDENT_AMBULATORY_CARE_PROVIDER_SITE_OTHER): Payer: Self-pay | Admitting: Pulmonary Disease

## 2020-01-04 ENCOUNTER — Other Ambulatory Visit: Payer: Self-pay

## 2020-01-04 ENCOUNTER — Encounter: Payer: Self-pay | Admitting: Adult Health

## 2020-01-04 DIAGNOSIS — R918 Other nonspecific abnormal finding of lung field: Secondary | ICD-10-CM | POA: Insufficient documentation

## 2020-01-04 LAB — PULMONARY FUNCTION TEST
DL/VA % pred: 67 %
DL/VA: 3.09 ml/min/mmHg/L
DLCO cor % pred: 64 %
DLCO cor: 13.93 ml/min/mmHg
DLCO unc % pred: 53 %
DLCO unc: 11.59 ml/min/mmHg
FEF 25-75 Post: 3.99 L/sec
FEF 25-75 Pre: 4.11 L/sec
FEF2575-%Change-Post: -2 %
FEF2575-%Pred-Post: 151 %
FEF2575-%Pred-Pre: 155 %
FEV1-%Change-Post: 0 %
FEV1-%Pred-Post: 100 %
FEV1-%Pred-Pre: 100 %
FEV1-Post: 2.85 L
FEV1-Pre: 2.85 L
FEV1FVC-%Change-Post: 1 %
FEV1FVC-%Pred-Pre: 113 %
FEV6-%Change-Post: 0 %
FEV6-%Pred-Post: 92 %
FEV6-%Pred-Pre: 92 %
FEV6-Post: 3.2 L
FEV6-Pre: 3.22 L
FEV6FVC-%Pred-Post: 104 %
FEV6FVC-%Pred-Pre: 104 %
FVC-%Change-Post: 0 %
FVC-%Pred-Post: 88 %
FVC-%Pred-Pre: 89 %
FVC-Post: 3.2 L
FVC-Pre: 3.23 L
Post FEV1/FVC ratio: 89 %
Post FEV6/FVC ratio: 100 %
Pre FEV1/FVC ratio: 88 %
Pre FEV6/FVC Ratio: 100 %
RV % pred: 70 %
RV: 1.1 L
TLC % pred: 93 %
TLC: 4.83 L

## 2020-01-04 NOTE — Progress Notes (Signed)
PFT done today. 

## 2020-01-04 NOTE — Patient Instructions (Addendum)
I will discuss with Dr. Judeth Horn regarding bronchoscopy with biopsy.  Salt water gargles as needed for sore throat.  Follow up with Dr. Judeth Horn in 4 weeks and As needed  Please contact office for sooner follow up if symptoms do not improve or worsen or seek emergency care

## 2020-01-04 NOTE — Progress Notes (Signed)
_0  ID: Manuel Hayes, male    DOB: 1970/06/01, 49 y.o.   MRN: [id number]  Chief Complaint  Patient presents with  . Follow-up    Pulmonary nodule     Referring provider: Karel Jarvis, NP  HPI: 49 year old Hispanic male non-English-speaking seen for pulmonary consult December 13, 2019 for pulmonary nodules .   TEST/EVENTS :  CT chest 11/2019 widespread subpleural  nodularity in the lungs, larger pulmonary nodules in the lungs bilaterally measuring up to 2.1 x 0.8 cm in the lingula.Multiple prominent borderline enlarged mediastinal and bilateral hilar lymph nodes are noted, nonspecific.  01/04/2020 Follow up : Pulmonary nodules (today's exam was completed with assistance of interpreter) Patient presents for a 2-week follow-up.  Patient was seen last visit for pulmonary consult.  Patient had presented to the emergency room with hiccups for 4 days, cough for few months.  CT chest showed bilateral primary upper lobe nodules with largest 2 cm in the lingula and bilateral hilar and mediastinal lymph nodes.  Recent lab work showed an elevated serum creatinine 1.4, total protein elevated at 9.5.  And albumin at 2.2. Patient was set up for extensive work-up with autoimmune and connective tissue labs.  Also QuantiFERON gold.  Patient's QuantiFERON gold did return back positive.  Patient was referred to the health department.  Sputum AFB culture was negative for TB per report from health department.  Lab work showed a mildly positive C ANCA 1: 20.  ESR 96.  Hemoglobin 9.8. Alk Phos elevated at 337 . P. Electrophersis - no abnormal protein bands. Gamma Globulin elevated 5.1 .   Patient was set up for pulmonary function testing completed today that showed normal lung function with a FEV1 at 100%, ratio 88, FVC 89%, no significant bronchodilator response.  Normal mid flows.  DLCO was decreased at 53%.  Has minimal cough. No hemoptysis. Sore and scratchy throat.   SH  Never smoker .  Has 1 roommate.  Came to [place] ~2015 . From [place].  Currently works as Curator.  No birds/chickens, pets.      No Known Allergies   There is no immunization history on file for this patient.  Past Medical History:  Diagnosis Date  . Hyperlipidemia   . Pulmonary nodule     Tobacco History: Social History   Tobacco Use  Smoking Status Never Smoker  Smokeless Tobacco Never Used   Counseling given: Not Answered   Outpatient Medications Prior to Visit  Medication Sig Dispense Refill  . gabapentin (NEURONTIN) 100 MG capsule Take 1 capsule (100 mg total) by mouth 3 (three) times daily. 90 capsule 2  . glipiZIDE (GLUCOTROL XL) 2.5 MG 24 hr tablet glipizide ER 2.5 mg tablet, extended release 24 hr  TAKE ONE TABLET BY MOUTH EVERY DAY    . levothyroxine (SYNTHROID) 25 MCG tablet TOMA UNA TABLETA POR LA BOCA DIARIA ANTES DEL DESAYUNO    . lisinopril (ZESTRIL) 5 MG tablet lisinopril 5 mg tablet  TAKE ONE TABLET BY MOUTH EVERY DAY    . lovastatin (MEVACOR) 20 MG tablet lovastatin 20 mg tablet  TAKE ONE TABLET BY MOUTH AT BEDTIME    . metFORMIN (GLUCOPHAGE) 1000 MG tablet Take 1,000 mg by mouth 2 (two) times daily.    . pantoprazole (PROTONIX) 40 MG tablet Take 1 tablet (40 mg total) by mouth daily. (Patient not taking: Reported on 01/04/2020) 30 tablet 1   No facility-administered medications prior to visit.     Review of Systems:  Constitutional:   No  weight loss, night sweats,  Fevers, chills,  +fatigue, or  lassitude.  HEENT:   No headaches,  Difficulty swallowing,  Tooth/dental problems, or  Sore throat,                No sneezing, itching, ear ache, nasal congestion, post nasal drip,   CV:  No chest pain,  Orthopnea, PND, swelling in lower extremities, anasarca, dizziness, palpitations, syncope.   GI  No heartburn, indigestion, abdominal pain, nausea, vomiting, diarrhea, change in bowel habits, loss of appetite, bloody stools.   Resp:   No chest wall deformity  Skin:  no rash or lesions.  GU: no dysuria, change in color of urine, no urgency or frequency.  No flank pain, no hematuria   MS:  No joint pain or swelling.  No decreased range of motion.  No back pain.    Physical Exam  BP 120/66 (BP Location: Left Arm, Cuff Size: Normal)   Pulse (!) 125   Temp 98.4 F (36.9 C) (Temporal)   Ht 5' 1" (1.549 m)   Wt 118 lb 9.6 oz (53.8 kg)   SpO2 93%   BMI 22.41 kg/m   GEN: A/Ox3; pleasant , NAD, BMI 22    HEENT:  [place]/AT,   , NOSE-clear, THROAT-clear, no lesions, no postnasal drip or exudate noted.   NECK:  Supple w/ fair ROM; no JVD; normal carotid impulses w/o bruits; no thyromegaly or nodules palpated; no lymphadenopathy.    RESP  Clear  P & A; w/o, wheezes/ rales/ or rhonchi. no accessory muscle use, no dullness to percussion  CARD:  RRR, no m/r/g, no peripheral edema, pulses intact, no cyanosis or clubbing.  GI:   Soft & nt; nml bowel sounds; no organomegaly or masses detected.   Musco: Warm bil, no deformities or joint swelling noted.   Neuro: alert, no focal deficits noted.    Skin: Warm, no lesions or rashes    Lab Results:  CBC    Component Value Date/Time   WBC 6.3 11/20/2019 0246   RBC 3.53 (L) 11/20/2019 0246   HGB 9.8 (L) 11/20/2019 0246   HCT 30.0 (L) 11/20/2019 0246   PLT 165 11/20/2019 0246   MCV 85.0 11/20/2019 0246   MCH 27.8 11/20/2019 0246   MCHC 32.7 11/20/2019 0246   RDW 14.3 11/20/2019 0246   LYMPHSABS 2.6 11/20/2019 0246   MONOABS 0.9 11/20/2019 0246   EOSABS 0.0 11/20/2019 0246   BASOSABS 0.0 11/20/2019 0246    BMET    Component Value Date/Time   NA 132 (L) 11/20/2019 0246   K 3.7 11/20/2019 0246   CL 100 11/20/2019 0246   CO2 26 11/20/2019 0246   GLUCOSE 121 (H) 11/20/2019 0246   BUN 33 (H) 11/20/2019 0246   CREATININE 1.42 (H) 11/20/2019 0246   CALCIUM 7.6 (L) 11/20/2019 0246   GFRNONAA 57 (L) 11/20/2019 0246   GFRAA >60 11/20/2019 0246   CMP Latest Ref Rng & Units 12/14/2019 11/20/2019    Glucose 70 - 99 mg/dL - 121(H)  BUN 6 - 20 mg/dL - 33(H)  Creatinine 0.61 - 1.24 mg/dL - 1.42(H)  Sodium 135 - 145 mmol/L - 132(L)  Potassium 3.5 - 5.1 mmol/L - 3.7  Chloride 98 - 111 mmol/L - 100  CO2 22 - 32 mmol/L - 26  Calcium 8.9 - 10.3 mg/dL - 7.6(L)  Total Protein 6.1 - 8.1 g/dL 9.9(H) 9.5(H)  Total Bilirubin 0.3 - 1.2 mg/dL - 0.6  Alkaline Phos 38 - 126 U/L - 331(H)  AST 15 - 41 U/L - 57(H)  ALT 0 - 44 U/L - 36   BNP No results found for: BNP  ProBNP No results found for: PROBNP  Imaging: No results found.    PFT Results Latest Ref Rng & Units 01/04/2020  FVC-Pre L 3.23  FVC-Predicted Pre % 89  FVC-Post L 3.20  FVC-Predicted Post % 88  Pre FEV1/FVC % % 88  Post FEV1/FCV % % 89  FEV1-Pre L 2.85  FEV1-Predicted Pre % 100  FEV1-Post L 2.85  DLCO uncorrected ml/min/mmHg 11.59  DLCO UNC% % 53  DLCO corrected ml/min/mmHg 13.93  DLCO COR %Predicted % 64  DLVA Predicted % 67  TLC L 4.83  TLC % Predicted % 93  RV % Predicted % 70    No results found for: NITRICOXIDE      Assessment & Plan:   Lung nodules Scattered widespread subpleural nodularity of the lungs larger pulmonary nodules in the lingula maximum measuring 2.1 cm.  Prominent borderline enlarged mediastinal and bilateral hilar lymph nodes questionable etiology. This is associated with mild cough, weight loss.  QuantiFERON gold was positive.  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 Lab work showing elevated serum creatinine, anemia with hemoglobin at 9.8 high alk phos at 337.  ESR high at 96.  C ANCA mildly positive at 1: 20.  Elevated gammaglobulin at 5.1. Pulmonary function testing shows normal lung function.  DLCO was decreased at 53%. We will discuss findings with Dr. Silas Flood and decide on proceeding with navigational bronchoscopy/EBUS.  On return we will check HIV.   Plan  Patient Instructions  I will discuss with Dr. Silas Flood regarding bronchoscopy  with biopsy.  Salt water gargles as needed for sore throat.  Follow up with Dr. Silas Flood in 4 weeks and As needed  Please contact office for sooner follow up if symptoms do not improve or worsen or seek emergency care            Rexene Edison, NP 01/04/2020

## 2020-01-04 NOTE — Assessment & Plan Note (Signed)
Scattered widespread subpleural nodularity of the lungs larger pulmonary nodules in the lingula maximum measuring 2.1 cm.  Prominent borderline enlarged mediastinal and bilateral hilar lymph nodes questionable etiology. This is associated with mild cough, weight loss.  QuantiFERON gold was positive.  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 Lab work showing elevated serum creatinine, anemia with hemoglobin at 9.8 high alk phos at 337.  ESR high at 96.  C ANCA mildly positive at 1: 20.  Elevated gammaglobulin at 5.1. Pulmonary function testing shows normal lung function.  DLCO was decreased at 53%. We will discuss findings with Dr. Silas Flood and decide on proceeding with navigational bronchoscopy/EBUS.  On return we will check HIV.   Plan  Patient Instructions  I will discuss with Dr. Silas Flood regarding bronchoscopy with biopsy.  Salt water gargles as needed for sore throat.  Follow up with Dr. Silas Flood in 4 weeks and As needed  Please contact office for sooner follow up if symptoms do not improve or worsen or seek emergency care

## 2020-01-31 ENCOUNTER — Telehealth: Payer: Self-pay

## 2020-01-31 NOTE — Telephone Encounter (Addendum)
Received call today from Hadassah Pais, NP from [facility] regarding new referral for TB and HIV diagnosis. States patient is under quarantine at home. Would like to schedule appointment ASAP. If any issues contacting patient can reach out to NP.  DIS and Health Department will update patient today regarding positive HIV diagnosis.  Hadassah Pais, NP P: [phone]

## 2020-02-02 ENCOUNTER — Telehealth: Payer: Self-pay | Admitting: *Deleted

## 2020-02-02 NOTE — Telephone Encounter (Signed)
Received new HIV referral for patient from Hadassah Pais at the [facility] Department.  RN called her back, relayed Dr Zenaida Niece Dam's message. 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 Dr Daiva Eves and [place] both aware of TB therapy, will need to craft regimen that can be taken with these meds. Andree Coss, RN

## 2020-02-06 NOTE — Telephone Encounter (Signed)
Patient being contacted by front desk to schedule financial and MD appointments for 9/23.

## 2020-02-06 NOTE — Telephone Encounter (Signed)
Santiago Glad called with update.  No signs of TB meningitis.  The smear was pansenstive to all medication, Ethambutol discontinued 9/16.  Patient has had 2 negative sputums, he is clear to come to [facility] for treatment. Also per Santiago Glad, patient's TB medication is now:   INH 300 MG, PZA 1000 mg, B6 50 mg, and can include either rifamycin or rifibutin in place of rifampin.     Patient has increasing LFTs (ALT 2 fold, AST 6 fold in 2 weeks), but is asymptomatic.  Per Dr Lorenda Cahill, ok to continue DOT with weekly labs as long as patient is tolerating the medication and the ALT remains stable.  Patient was surprised by his positive HIV result, stating he has not been sexually active for more than 4 years.  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  He is very anxious to start HIV treatment.  Hepatitis B, HLA-B, viral load status unknown. Would it be ok to schedule him to see you same day as financial intake? Please advise. Landis Gandy, RN

## 2020-02-06 NOTE — Telephone Encounter (Signed)
Sure

## 2020-02-07 NOTE — Telephone Encounter (Signed)
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

## 2020-02-07 NOTE — Telephone Encounter (Signed)
Dr. Van Dam - I am out of the office tomorrow morning for an appointment. I've discussed with Michelle. Tivicay does not interact with rifabutin if that is what he is switched to. Because we will not know Hep B status or HIV viral load at the time of medication initiation, would favor starting Tivicay + Truvada for him. TAF is contraindicated with rifabutin so cannot do Descovy. Triumeq or Dovato would be risky due to no HLA status and no Hep B info. Just FYI, thanks!

## 2020-02-08 ENCOUNTER — Ambulatory Visit: Payer: Self-pay

## 2020-02-08 ENCOUNTER — Other Ambulatory Visit: Payer: Self-pay

## 2020-02-08 ENCOUNTER — Encounter: Payer: Self-pay | Admitting: Infectious Disease

## 2020-02-08 ENCOUNTER — Ambulatory Visit (INDEPENDENT_AMBULATORY_CARE_PROVIDER_SITE_OTHER): Payer: Self-pay | Admitting: Infectious Disease

## 2020-02-08 VITALS — BP 132/85 | HR 96

## 2020-02-08 DIAGNOSIS — B20 Human immunodeficiency virus [HIV] disease: Secondary | ICD-10-CM

## 2020-02-08 DIAGNOSIS — A150 Tuberculosis of lung: Secondary | ICD-10-CM

## 2020-02-08 DIAGNOSIS — E1165 Type 2 diabetes mellitus with hyperglycemia: Secondary | ICD-10-CM

## 2020-02-08 DIAGNOSIS — Z23 Encounter for immunization: Secondary | ICD-10-CM

## 2020-02-08 DIAGNOSIS — H538 Other visual disturbances: Secondary | ICD-10-CM

## 2020-02-08 DIAGNOSIS — R918 Other nonspecific abnormal finding of lung field: Secondary | ICD-10-CM

## 2020-02-08 DIAGNOSIS — I10 Essential (primary) hypertension: Secondary | ICD-10-CM

## 2020-02-08 DIAGNOSIS — E785 Hyperlipidemia, unspecified: Secondary | ICD-10-CM

## 2020-02-08 HISTORY — DX: Tuberculosis of lung: A15.0

## 2020-02-08 HISTORY — DX: Human immunodeficiency virus (HIV) disease: B20

## 2020-02-08 MED ORDER — SULFAMETHOXAZOLE-TRIMETHOPRIM 800-160 MG PO TABS: 1.0000 | ORAL_TABLET | Freq: Every day | ORAL | 5 refills | Status: DC

## 2020-02-08 MED ORDER — NYSTATIN 100000 UNIT/ML MT SUSP: 5.0000 mL | Freq: Four times a day (QID) | OROMUCOSAL | 2 refills | Status: DC

## 2020-02-08 MED ORDER — NYSTATIN 100000 UNIT/ML MT SUSP: 5.0000 mL | Freq: Four times a day (QID) | OROMUCOSAL | 0 refills | Status: DC

## 2020-02-08 MED ORDER — SULFAMETHOXAZOLE-TRIMETHOPRIM 800-160 MG PO TABS: 1.0000 | ORAL_TABLET | Freq: Every day | ORAL | 0 refills | Status: DC

## 2020-02-08 NOTE — Progress Notes (Signed)
Chief complaint: here to establish care for HIV in context of pulmonary TB  Subjective:    Patient ID: Manuel Hayes, male    DOB: Aug 26, 1970, 49 y.o.   MRN: [id number]  HPI  Manuel Hayes is a 49 year old Latin American man with newly diagnosed HIV and pulmonary TB. He has been on 4 drug regimen for TB, recently changed from rifampin to rifabutin. He has troubles with transaminitis that is being closely followed by [facility] and Dr.Stout.  I also noted blurry vision in record of another facility.   He has comorbid DM, hyperlipidemia.  In talking to him with video Spanish interpreter (and later with Sheppard Coil) he endorsed intermittent blurry vision both near when reading his phone and far away.  He has thrush on tongue. On questioning him he has had some dysphagia.  + weight loss and cough is getting better.   Past Medical History:  Diagnosis Date  . HIV disease (HCC) 02/08/2020  . Hyperlipidemia   . Pulmonary nodule   . 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  No family history on file.    Social History   Socioeconomic History  . Marital status: Single    Spouse name: Not on file  . Number of children: Not on file  . Years of education: Not on file  . Highest education level: Not on file  Occupational History  . Not on file  Tobacco Use  . Smoking status: Never Smoker  . Smokeless tobacco: Never Used  Substance and Sexual Activity  . Alcohol use: Not on file  . Drug use: Not on file  . Sexual activity: Not on file  Other Topics Concern  . Not on file  Social History Narrative  . Not on file   Social Determinants of Health   Financial Resource Strain:   . Difficulty of Paying Living Expenses: Not on file  Food Insecurity:   . Worried About Programme researcher, broadcasting/film/video in the Last Year: Not on file  . Ran Out of Food in the Last Year: Not on file  Transportation Needs:   . Lack of Transportation (Medical): Not on file  . Lack of  Transportation (Non-Medical): Not on file  Physical Activity:   . Days of Exercise per Week: Not on file  . Minutes of Exercise per Session: Not on file  Stress:   . Feeling of Stress : Not on file  Social Connections:   . Frequency of Communication with Friends and Family: Not on file  . Frequency of Social Gatherings with Friends and Family: Not on file  . Attends Religious Services: Not on file  . Active Member of Clubs or Organizations: Not on file  . Attends Banker Meetings: Not on file  . Marital Status: Not on file    No Known Allergies   Current Outpatient Medications:  .  gabapentin (NEURONTIN) 100 MG capsule, Take 1 capsule (100 mg total) by mouth 3 (three) times daily., Disp: 90 capsule, Rfl: 2 .  glipiZIDE (GLUCOTROL XL) 2.5 MG 24 hr tablet, glipizide ER 2.5 mg tablet, extended release 24 hr  TAKE ONE TABLET BY MOUTH EVERY DAY, Disp: , Rfl:  .  levothyroxine (SYNTHROID) 25 MCG tablet, TOMA UNA TABLETA POR LA BOCA DIARIA ANTES DEL DESAYUNO, Disp: , Rfl:  .  lisinopril (ZESTRIL) 5 MG tablet, lisinopril 5 mg tablet  TAKE ONE TABLET BY MOUTH EVERY DAY, Disp: , Rfl:  .  lovastatin (  MEVACOR) 20 MG tablet, lovastatin 20 mg tablet  TAKE ONE TABLET BY MOUTH AT BEDTIME, Disp: , Rfl:  .  metFORMIN (GLUCOPHAGE) 1000 MG tablet, Take 1,000 mg by mouth 2 (two) times daily., Disp: , Rfl:  .  hydrocortisone (PROCTOSOL HC) 2.5 % rectal cream, Proctosol HC 2.5 % topical cream perineal applicator  APPLY TO RECTUM 2 3 TIMES DAILY, Disp: , Rfl:  .  nystatin (MYCOSTATIN) 100000 UNIT/ML suspension, Take 5 mLs (500,000 Units total) by mouth 4 (four) times daily for 14 days., Disp: 60 mL, Rfl: 2 .  pantoprazole (PROTONIX) 40 MG tablet, Take 1 tablet (40 mg total) by mouth daily. (Patient not taking: Reported on 01/04/2020), Disp: 30 tablet, Rfl: 1 .  sulfamethoxazole-trimethoprim (BACTRIM DS) 800-160 MG tablet, Take 1 tablet by mouth daily., Disp: 30 tablet, Rfl: 5   Review of Systems    Constitutional: Positive for fatigue, fever and unexpected weight change. Negative for activity change, appetite change, chills and diaphoresis.  HENT: Positive for sore throat and trouble swallowing. Negative for congestion, rhinorrhea, sinus pressure and sneezing.   Eyes: Negative for photophobia and visual disturbance.  Respiratory: Positive for cough. Negative for chest tightness, shortness of breath, wheezing and stridor.   Cardiovascular: Negative for chest pain, palpitations and leg swelling.  Gastrointestinal: Negative for abdominal distention, abdominal pain, anal bleeding, blood in stool, constipation, diarrhea, nausea and vomiting.  Genitourinary: Negative for difficulty urinating, dysuria, flank pain and hematuria.  Musculoskeletal: Negative for arthralgias, back pain, gait problem, joint swelling and myalgias.  Skin: Negative for color change, pallor, rash and wound.  Neurological: Negative for dizziness, tremors, weakness and light-headedness.  Hematological: Negative for adenopathy. Does not bruise/bleed easily.  Psychiatric/Behavioral: Positive for dysphoric mood. Negative for agitation, behavioral problems, confusion, decreased concentration and sleep disturbance. The patient is nervous/anxious.        Objective:   Physical Exam Constitutional:      General: He is not in acute distress.    Appearance: Normal appearance. He is well-developed. He is not ill-appearing or diaphoretic.  HENT:     Head: Normocephalic and atraumatic.     Right Ear: Hearing and external ear normal.     Left Ear: Hearing and external ear normal.     Nose: No nasal deformity or rhinorrhea.     Mouth/Throat:     Tongue: Lesions present.     Comments: thrush Eyes:     General: No scleral icterus.    Conjunctiva/sclera: Conjunctivae normal.     Right eye: Right conjunctiva is not injected.     Left eye: Left conjunctiva is not injected.     Pupils: Pupils are equal, round, and reactive to  light.  Neck:     Vascular: No JVD.  Cardiovascular:     Rate and Rhythm: Normal rate and regular rhythm.     Heart sounds: Normal heart sounds, S1 normal and S2 normal. No murmur heard.  No friction rub.  Pulmonary:     Effort: No respiratory distress.     Breath sounds: No stridor. No wheezing or rhonchi.  Abdominal:     General: Bowel sounds are normal. There is no distension.     Palpations: Abdomen is soft.     Tenderness: There is no abdominal tenderness.  Musculoskeletal:        General: Normal range of motion.     Right shoulder: Normal.     Left shoulder: Normal.     Cervical back: Normal range of motion and  neck supple.     Right hip: Normal.     Left hip: Normal.     Right knee: Normal.     Left knee: Normal.  Lymphadenopathy:     Head:     Right side of head: No submandibular, preauricular or posterior auricular adenopathy.     Left side of head: No submandibular, preauricular or posterior auricular adenopathy.     Cervical: No cervical adenopathy.     Right cervical: No superficial or deep cervical adenopathy.    Left cervical: No superficial or deep cervical adenopathy.  Skin:    General: Skin is warm and dry.     Coloration: Skin is not pale.     Findings: No abrasion, bruising, ecchymosis, erythema, lesion or rash.     Nails: There is no clubbing.  Neurological:     Mental Status: He is alert and oriented to person, place, and time.     Sensory: No sensory deficit.     Coordination: Coordination normal.     Gait: Gait normal.  Psychiatric:        Attention and Perception: He is attentive.        Speech: Speech normal.        Behavior: Behavior normal. Behavior is cooperative.        Thought Content: Thought content normal.        Judgment: Judgment normal.           Assessment & Plan:   Pulmonary TB: continue medications with swap in of rifabutin for rifampin.  Transamiinitits: continue to monitor and avoid hepatoxic drugs  Blurry vision: I  am very worried he could have an OI such as CMV retinitis other intra-ocular infection. I therefore DO NOT want to initiate ARV until we know IF he has an OI and have treated it for 4-5 weeks first. IRIS in the eye or brain can be very dangerous if life threatening.   Checking CMV, HSV serologies, Toxoplasma, RPR,   Thrush: nystatin swish and swallow for now  HIV disease: check labs today, start Bactrim for PCP prevention. IF we can be sure of no OI in eyes then would start him on a regimen, Dovato would be an option IF no HBV co-infection. IF we need TNF need to keep in mind we will need TDF formulation while his is on his TB meds  Depressive symptoms: he suffers from shock of diagnosis, anxiety about his health will connect him to [facility], consider SSRI in future  DM: on metformin. May have worsening diarrhea if we put him on DTG  Hyperlipidemia: no statin regiht now  Hypothyroidism: on synthroid  I spent greater than 60 minutes with the patient including greater than 50% of time in face to face counsel of the patient re the nature of HIV, OI, immune system, TB, DM blurry vision, depressive symptoms  in coordination of his care with pharmacy, RN staff and Kelby Fam

## 2020-02-08 NOTE — Patient Instructions (Signed)
Please scheduled patient with Dr. Renold Don or other provider with avialability in next 2-3 weeks

## 2020-02-09 ENCOUNTER — Ambulatory Visit (INDEPENDENT_AMBULATORY_CARE_PROVIDER_SITE_OTHER): Payer: Self-pay

## 2020-02-09 DIAGNOSIS — Z23 Encounter for immunization: Secondary | ICD-10-CM

## 2020-02-09 LAB — URINE CYTOLOGY ANCILLARY ONLY
Chlamydia: NEGATIVE
Comment: NEGATIVE
Comment: NORMAL
Neisseria Gonorrhea: NEGATIVE

## 2020-02-09 LAB — T-HELPER CELL (CD4) - (RCID CLINIC ONLY)
CD4 % Helper T Cell: 3 % — ABNORMAL LOW (ref 33–65)
CD4 T Cell Abs: 50 /uL — ABNORMAL LOW (ref 400–1790)

## 2020-02-09 NOTE — Progress Notes (Signed)
   Covid-19 Vaccination Clinic  Name:  Manuel Hayes    MRN: [id number] DOB: 08/26/1970  02/09/2020  Mr. Manuel Hayes was observed post Covid-19 immunization for 15 minutes without incident. He was provided with Vaccine Information Sheet and instruction to access the V-Safe system.   Mr. Manuel Hayes was instructed to call 911 with any severe reactions post vaccine: [name] Kitchen Difficulty breathing  . Swelling of face and throat  . A fast heartbeat  . A bad rash all over body  . Dizziness and weakness   Immunizations Administered    Name Date Dose VIS Date Route   Pfizer COVID-19 Vaccine 02/09/2020 11:16 AM 0.3 mL 07/12/2018 Intramuscular   Manufacturer: [employer], [employer]   Lot: [id number]   NDC: [address]-8    [name] [name] Resides  Visit done with Spanish interpreter

## 2020-02-09 NOTE — Progress Notes (Addendum)
   Covid-19 Vaccination Clinic  Name:  Theresa Dohrman    MRN: [id number] DOB: 10-26-1970  02/09/2020  Mr. Manual Meier was observed post Covid-19 immunization for 15 minutes without incident. He was provided with Vaccine Information Sheet and instruction to access the V-Safe system.   Mr. Manual Meier was instructed to call 911 with any severe reactions post vaccine: [name] Kitchen Difficulty breathing  . Swelling of face and throat  . A fast heartbeat  . A bad rash all over body  . Dizziness and weakness   Immunizations Administered    Name Date Dose VIS Date Route   Pfizer COVID-19 Vaccine 02/09/2020 11:16 AM 0.3 mL 07/12/2018 Intramuscular   Manufacturer: [employer], [employer]   Lot: [id number]   NDC: [address]-5    Laurell Josephs, RN  Spanish interpreter used via [employer] interpreter .

## 2020-02-12 ENCOUNTER — Ambulatory Visit (INDEPENDENT_AMBULATORY_CARE_PROVIDER_SITE_OTHER): Payer: Self-pay | Admitting: Pulmonary Disease

## 2020-02-12 ENCOUNTER — Encounter: Payer: Self-pay | Admitting: Pulmonary Disease

## 2020-02-12 ENCOUNTER — Other Ambulatory Visit: Payer: Self-pay

## 2020-02-12 VITALS — BP 102/60 | HR 116 | Temp 97.6°F | Ht 61.0 in | Wt 113.8 lb

## 2020-02-12 DIAGNOSIS — A150 Tuberculosis of lung: Secondary | ICD-10-CM

## 2020-02-12 DIAGNOSIS — R918 Other nonspecific abnormal finding of lung field: Secondary | ICD-10-CM

## 2020-02-12 NOTE — Progress Notes (Signed)
Patient ID: Manuel Hayes, male    DOB: 1970/12/04, 49 y.o.   MRN: [id number]  Chief Complaint  Patient presents with  . Follow-up    much better since last visit.    Referring provider: Karel Jarvis, NP  Follow up visit 02/06/2020: After last visit, QuantiFERON gold resulted positive making pulmonary nodules almost certainly tuberculosis.  Referred to the [facility].  Still like for a while that given he had negative smears he was not on treatment.  However, per EMR early September he had growth and appeared to be pansensitive.  He was started on therapy and is following with infectious disease who is managing this.  His HIV test obtained via ID also was positive.  Currently he is taking what appears to be isoniazid, pyrazinamide, B6, and rifabutin.  He says has been taking this for about 3 weeks.  He describes direct observed therapy Monday through Friday with nurse bringing him medicines and observing him taking them.  He reports adherence to Saturday and Sunday doses as well.  His cough is greatly improved, almost gone.  His sore throat is improved.  His weight nadired 110 pounds, up to 113 pounds today.  Still well below his baseline weight in the 140s.  Per chart review, he is likely to start antiretroviral therapy in the coming days.  Given his description of blurry vision he is been referred to evaluate for opportunistic infection in the eyes prior to starting therapies.  HPI at initial visit:  Manuel Hayes is a 49 year old with DM whom we are seeing in consultation at the request of Michail Jewels NP for evaluation of lung nodules.  Patient had hiccups for 4 days without any relief. This prompted ED visit where CT chest with my interpretation as bilateral primarily upper lobe nodules with largest 2 cm in lingula and bilateral hilar and mediastinal lymph nodes seen. He reports cough present the last few months. Clear phlegm. No clear triggers  or alleviating factors. Not worse at night. No clear timing or pattern during the day. Associated with sore throat when he coughs. No DOE or SOB or chest pain.  No recent weight loss. Has lost 25 pounds over last several years after diagnosis of DM. No night sweats or fever. No headaches or muscle weakness.  He is a never smoker. No history of lung cancer in the family.  Review of PCP note and labs drawn in 08/2019 with anemia Hgb 11s and Cr 1.02. Noted to have hypoalbuminemia and elevated total protein. Labs 11/2019 with Cr 1.4, hgb 9.8, total protein 9.5, albumin 2.2.   PMH: DM, HTN Past surgical History: History reviewed. No pertinent surgical history.  Family History:History reviewed. No pertinent family history.  Social History: Lives in [place], never smoker, works as Curator  Imaging: Personally reviewed and interpreted as above  Lab Results: Personally Reviewed, pertinent values noted in HPI CBC    Component Value Date/Time   WBC 5.8 02/08/2020 1112   RBC 4.00 (L) 02/08/2020 1112   HGB 11.5 (L) 02/08/2020 1112   HCT 34.3 (L) 02/08/2020 1112   PLT 145 02/08/2020 1112   MCV 85.8 02/08/2020 1112   MCH 28.8 02/08/2020 1112   MCHC 33.5 02/08/2020 1112   RDW 14.3 02/08/2020 1112   LYMPHSABS 1,885 02/08/2020 1112   MONOABS 0.9 11/20/2019 0246   EOSABS 70 02/08/2020 1112   BASOSABS 29 02/08/2020 1112    BMET    Component Value Date/Time  NA 126 (L) 02/08/2020 1112   K 4.3 02/08/2020 1112   CL 96 (L) 02/08/2020 1112   CO2 25 02/08/2020 1112   GLUCOSE 95 02/08/2020 1112   BUN 21 02/08/2020 1112   CREATININE 0.88 02/08/2020 1112   CALCIUM 7.7 (L) 02/08/2020 1112   GFRNONAA 99 02/08/2020 1112   GFRAA 115 02/08/2020 1112    BNP No results found for: BNP  ProBNP No results found for: PROBNP   No Known Allergies  Immunization History  Administered Date(s) Administered  . Influenza,inj,Quad PF,6+ Mos 02/08/2020  . PFIZER SARS-COV-2 Vaccination 02/09/2020     Past Medical History:  Diagnosis Date  . HIV disease ([place]) 02/08/2020  . Hyperlipidemia   . Pulmonary nodule   . 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 gabapentin (NEURONTIN) 100 MG capsule Take 1 capsule (100 mg total) by mouth 3 (three) times daily.  [name] Kitchen glipiZIDE (GLUCOTROL XL) 2.5 MG 24 hr tablet glipizide ER 2.5 mg tablet, extended release 24 hr  TAKE ONE TABLET BY MOUTH EVERY DAY  . hydrocortisone (PROCTOSOL HC) 2.5 % rectal cream Proctosol HC 2.5 % topical cream perineal applicator  APPLY TO RECTUM 2 3 TIMES DAILY  . levothyroxine (SYNTHROID) 25 MCG tablet TOMA UNA TABLETA POR LA BOCA DIARIA ANTES DEL DESAYUNO  . lisinopril (ZESTRIL) 5 MG tablet lisinopril 5 mg tablet  TAKE ONE TABLET BY MOUTH EVERY DAY  . lovastatin (MEVACOR) 20 MG tablet lovastatin 20 mg tablet  TAKE ONE TABLET BY MOUTH AT BEDTIME  . metFORMIN (GLUCOPHAGE) 1000 MG tablet Take 1,000 mg by mouth 2 (two) times daily.  [name] Kitchen nystatin (MYCOSTATIN) 100000 UNIT/ML suspension Take 5 mLs (500,000 Units total) by mouth 4 (four) times daily for 14 days.  . pantoprazole (PROTONIX) 40 MG tablet Take 1 tablet (40 mg total) by mouth daily.  [name] Kitchen sulfamethoxazole-trimethoprim (BACTRIM DS) 800-160 MG tablet Take 1 tablet by mouth daily.   No facility-administered encounter medications on file as of 02/12/2020.     Review of Systems  Review of Systems  Scant hemoptysis few weeks ago, none recent. Frequent reflux symptoms. Comprehensive revise of systems otherwise negative  Physical Exam  BP 102/60 (BP Location: Right Arm, Cuff Size: Normal)   Pulse (!) 116   Temp 97.6 F (36.4 C) (Temporal)   Ht 5' 1"  (1.549 m)   Wt 113 lb 12.8 oz (51.6 kg)   SpO2 100%   BMI 21.50 kg/m   Wt Readings from Last 5 Encounters:   02/12/20 113 lb 12.8 oz (51.6 kg)  01/04/20 118 lb 9.6 oz (53.8 kg)  12/13/19 121 lb (54.9 kg)  11/20/19 119 lb (54 kg)    BMI Readings from Last 5 Encounters:  02/12/20 21.50 kg/m  01/04/20 22.41 kg/m  12/13/19 22.13 kg/m  11/20/19 23.24 kg/m     Physical Exam General: Thin, in NAD Eyes: EOMI, no icterus Mouth: MMM, lips CDI Respiratory: Mild expiratory crackles R mid lung field, otherwise CTAB, NWOB on RA Cardiovascular: RRR, no murmurs Abdomen: soft, NT, BS present Skin: No rash, no lesions, warm, dry Neuro: normal gait, CN intact Psych: normal mood, full affect   Assessment & Plan:   Manuel Hayes is a 49 year old with DM whom we are seeing in follow up of lung nodules  found to have TB.  Multiple bilateral upper lobe predominant lung nodules. Initial differential includes TB, malignancy, vasculitis, sarcoid, and other non-infectious inflammatory conditions.  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  Greatly appreciate their expertise.  Fortunately, prior symptoms all are improving with therapy.   Part of initial work-up included SPEP given concern for multiple myeloma in the setting of elevated gamma gap and renal dysfunction as well as anemia.  SPEP is negative.  Suspect lab abnormalities due to inflammatory state in the setting of tuberculosis.  No need for routine pulmonary follow-up.  Patient to call clinic with new or worsening symptoms and can be evaluated at that time.  Return if symptoms worsen or fail to improve.   Lanier Clam, MD 02/12/2020

## 2020-02-12 NOTE — Patient Instructions (Signed)
  Me alegro de que ests mejor!  Contine tomando los medicamentos para la tuberculosis. Pregntele a la enfermera o al Dr. Daiva Eves cunto tiempo estar tomando el medicamento.  Mantenga todas sus citas con el Dr. Daiva Eves y su oficina.  No hay necesidad de una cita de seguimiento conmigo.  Llmanos si necesitas algo.

## 2020-02-16 ENCOUNTER — Telehealth: Payer: Self-pay

## 2020-02-16 DIAGNOSIS — B20 Human immunodeficiency virus [HIV] disease: Secondary | ICD-10-CM

## 2020-02-16 LAB — HM DIABETES EYE EXAM

## 2020-02-16 MED ORDER — NYSTATIN 100000 UNIT/ML MT SUSP: 5.0000 mL | Freq: Four times a day (QID) | OROMUCOSAL | 2 refills | Status: AC

## 2020-02-16 MED ORDER — SULFAMETHOXAZOLE-TRIMETHOPRIM 800-160 MG PO TABS
1.0000 | ORAL_TABLET | Freq: Every day | ORAL | 5 refills | Status: AC
Start: 2020-02-16 — End: 2020-03-17

## 2020-02-16 NOTE — Telephone Encounter (Signed)
Received call from [facility] specialty to send prescriptions to [address]. Pharmacy has already contacted patient and made aware of pharmacy change. Valarie Cones

## 2020-02-22 LAB — TOXOPLASMA ANTIBODIES- IGG AND  IGM
Toxoplasma Antibody- IgM: <8 AU/mL
Toxoplasma IgG Ratio: 7.27 IU/mL — ABNORMAL HIGH

## 2020-02-22 LAB — CMV IGM: CMV IgM: <30 AU/mL

## 2020-02-22 LAB — COMPLETE METABOLIC PANEL WITH GFR
AG Ratio: 0.3 (calc) — ABNORMAL LOW (ref 1.0–2.5)
ALT: 75 U/L — ABNORMAL HIGH (ref 9–46)
AST: 92 U/L — ABNORMAL HIGH (ref 10–35)
Albumin: 2.3 g/dL — ABNORMAL LOW (ref 3.6–5.1)
Alkaline phosphatase (APISO): 253 U/L — ABNORMAL HIGH (ref 35–144)
BUN: 21 mg/dL (ref 7–25)
CO2: 25 mmol/L (ref 20–32)
Calcium: 7.7 mg/dL — ABNORMAL LOW (ref 8.6–10.3)
Chloride: 96 mmol/L — ABNORMAL LOW (ref 98–110)
Creat: 0.88 mg/dL (ref 0.70–1.33)
GFR, Est African American: 115 mL/min/{1.73_m2} (ref >=60)
GFR, Est Non African American: 99 mL/min/{1.73_m2} (ref >=60)
Globulin: 7.3 g/dL (calc) — ABNORMAL HIGH (ref 1.9–3.7)
Glucose, Bld: 95 mg/dL (ref 65–99)
Potassium: 4.3 mmol/L (ref 3.5–5.3)
Sodium: 126 mmol/L — ABNORMAL LOW (ref 135–146)
Total Bilirubin: 0.4 mg/dL (ref 0.2–1.2)
Total Protein: 9.6 g/dL — ABNORMAL HIGH (ref 6.1–8.1)

## 2020-02-22 LAB — HIV RNA, RTPCR W/R GT (RTI, PI,INT)
HIV 1 RNA Quant: 197000 copies/mL — ABNORMAL HIGH
HIV-1 RNA Quant, Log: 5.29 Log copies/mL — ABNORMAL HIGH

## 2020-02-22 LAB — HIV-1 GENOTYPE: HIV-1 Genotype: DETECTED — AB

## 2020-02-22 LAB — CBC WITH DIFFERENTIAL/PLATELET
Absolute Monocytes: 539 cells/uL (ref 200–950)
Basophils Absolute: 29 cells/uL (ref 0–200)
Basophils Relative: 0.5 %
Eosinophils Absolute: 70 cells/uL (ref 15–500)
Eosinophils Relative: 1.2 %
HCT: 34.3 % — ABNORMAL LOW (ref 38.5–50.0)
Hemoglobin: 11.5 g/dL — ABNORMAL LOW (ref 13.2–17.1)
Lymphs Abs: 1885 cells/uL (ref 850–3900)
MCH: 28.8 pg (ref 27.0–33.0)
MCHC: 33.5 g/dL (ref 32.0–36.0)
MCV: 85.8 fL (ref 80.0–100.0)
MPV: 12.6 fL — ABNORMAL HIGH (ref 7.5–12.5)
Monocytes Relative: 9.3 %
Neutro Abs: 3277 cells/uL (ref 1500–7800)
Neutrophils Relative %: 56.5 %
Platelets: 145 10*3/uL (ref 140–400)
RBC: 4 10*6/uL — ABNORMAL LOW (ref 4.20–5.80)
RDW: 14.3 % (ref 11.0–15.0)
Total Lymphocyte: 32.5 %
WBC: 5.8 10*3/uL (ref 3.8–10.8)

## 2020-02-22 LAB — HEPATITIS C ANTIBODY
Hepatitis C Ab: NONREACTIVE
SIGNAL TO CUT-OFF: 0.21 (ref <1.00)

## 2020-02-22 LAB — HEPATITIS A ANTIBODY, TOTAL: Hepatitis A AB,Total: REACTIVE — AB

## 2020-02-22 LAB — RPR: RPR Ser Ql: NONREACTIVE

## 2020-02-22 LAB — HSV 1/2 AB (IGM), IFA W/RFLX TITER
HSV 1 IgM Screen: NEGATIVE
HSV 2 IgM Screen: NEGATIVE

## 2020-02-22 LAB — HIV-1 INTEGRASE GENOTYPE

## 2020-02-22 LAB — HEPATITIS B SURFACE ANTIBODY,QUALITATIVE: Hep B S Ab: NONREACTIVE

## 2020-02-22 LAB — HLA B*5701: HLA-B*5701 w/rflx HLA-B High: NEGATIVE

## 2020-02-22 LAB — VARICELLA ZOSTER ANTIBODY, IGG: Varicella IgG: 216.4 index

## 2020-02-22 LAB — HEPATITIS B SURFACE ANTIGEN: Hepatitis B Surface Ag: NONREACTIVE

## 2020-02-29 ENCOUNTER — Telehealth: Payer: Self-pay | Admitting: Infectious Disease

## 2020-02-29 ENCOUNTER — Ambulatory Visit (INDEPENDENT_AMBULATORY_CARE_PROVIDER_SITE_OTHER): Payer: Self-pay | Admitting: Pharmacist

## 2020-02-29 ENCOUNTER — Other Ambulatory Visit: Payer: Self-pay

## 2020-02-29 DIAGNOSIS — A150 Tuberculosis of lung: Secondary | ICD-10-CM

## 2020-02-29 DIAGNOSIS — B20 Human immunodeficiency virus [HIV] disease: Secondary | ICD-10-CM

## 2020-02-29 MED ORDER — DOVATO 50-300 MG PO TABS: 1.0000 | ORAL_TABLET | Freq: Every day | ORAL | 5 refills | Status: DC

## 2020-02-29 NOTE — Progress Notes (Signed)
HPI: Manuel Hayes is a 49 y.o. male who presents to the [facility] clinic today to initiate treatment for his newly diagnosed HIV infection.  Patient Active Problem List   Diagnosis Date Noted  . 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 HIV disease (HCC) 02/08/2020  . Lung nodules 01/04/2020  . Pruritic rash 03/16/2018  . Anemia 12/27/2017  . Hyperproteinemia 07/05/2017  . Hypoalbuminemia 07/05/2017  . Albuminuria 11/21/2016  . Elevated liver enzymes 08/21/2016  . Benign essential hypertension 08/21/2016  . Dyslipidemia 08/21/2016  . Type 2 diabetes mellitus (HCC) 08/21/2016  . Blurring of visual image 08/21/2016    Patient's Medications  New Prescriptions   DOLUTEGRAVIR-LAMIVUDINE (DOVATO) 50-300 MG TABS    Take 1 tablet by mouth daily.  Previous Medications   GABAPENTIN (NEURONTIN) 100 MG CAPSULE    Take 1 capsule (100 mg total) by mouth 3 (three) times daily.   GLIPIZIDE (GLUCOTROL XL) 2.5 MG 24 HR TABLET    glipizide ER 2.5 mg tablet, extended release 24 hr  TAKE ONE TABLET BY MOUTH EVERY DAY   HYDROCORTISONE (PROCTOSOL HC) 2.5 % RECTAL CREAM    Proctosol HC 2.5 % topical cream perineal applicator  APPLY TO RECTUM 2 3 TIMES DAILY   LEVOTHYROXINE (SYNTHROID) 25 MCG TABLET    TOMA UNA TABLETA POR LA BOCA DIARIA ANTES DEL DESAYUNO   LISINOPRIL (ZESTRIL) 5 MG TABLET    lisinopril 5 mg tablet  TAKE ONE TABLET BY MOUTH EVERY DAY   LOVASTATIN (MEVACOR) 20 MG TABLET    lovastatin 20 mg tablet  TAKE ONE TABLET BY MOUTH AT BEDTIME   METFORMIN (GLUCOPHAGE) 1000 MG TABLET    Take 1,000 mg by mouth 2 (two) times daily.   NYSTATIN (MYCOSTATIN) 100000 UNIT/ML SUSPENSION    Take 5 mLs (500,000 Units total) by mouth 4 (four) times daily for 14 days.   PANTOPRAZOLE (PROTONIX) 40 MG TABLET    Take 1 tablet (40 mg total) by mouth daily.   SULFAMETHOXAZOLE-TRIMETHOPRIM (BACTRIM DS) 800-160 MG TABLET    Take 1 tablet by mouth daily.  Modified Medications   No medications on file    Discontinued Medications   No medications on file    Allergies: No Known Allergies  Past Medical History: Past Medical History:  Diagnosis Date  . HIV disease (HCC) 02/08/2020  . Hyperlipidemia   . Pulmonary nodule   . 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 Marital status: Single    Spouse name: Not on file  . Number of children: Not on file  . Years of education: Not on file  . Highest education level: Not on file  Occupational History  . Not on file  Tobacco Use  . Smoking status: Never Smoker  . Smokeless tobacco: Never Used  Substance and Sexual Activity  . Alcohol use: Not on file  . Drug use: Not on file  . Sexual activity: Not on file  Other Topics Concern  . Not on file  Social History Narrative  . Not on file   Social Determinants of Health   Financial Resource Strain:   . Difficulty of Paying Living Expenses: Not on file  Food Insecurity:   . Worried About Programme researcher, broadcasting/film/video in the Last Year: Not on file  . Ran Out of Food in the Last Year: Not on file  Transportation Needs:   . Lack of Transportation (Medical): Not on file  . Lack of Transportation (Non-Medical):  Not on file  Physical Activity:   . Days of Exercise per Week: Not on file  . Minutes of Exercise per Session: Not on file  Stress:   . Feeling of Stress : Not on file  Social Connections:   . Frequency of Communication with Friends and Family: Not on file  . Frequency of Social Gatherings with Friends and Family: Not on file  . Attends Religious Services: Not on file  . Active Member of Clubs or Organizations: Not on file  . Attends Banker Meetings: Not on file  . Marital Status: Not on file    Labs: Lab Results  Component Value Date   HIV1RNAQUANT 197,000 (H) 02/08/2020   CD4TABS 50 (L) 02/08/2020    RPR and STI Lab Results  Component Value Date   LABRPR NON-REACTIVE 02/08/2020    STI Results GC CT   02/08/2020 Negative Negative    Hepatitis B Lab Results  Component Value Date   HEPBSAB NON-REACTIVE 02/08/2020   HEPBSAG NON-REACTIVE 02/08/2020   Hepatitis C Lab Results  Component Value Date   HEPCAB NON-REACTIVE 02/08/2020   Hepatitis A Lab Results  Component Value Date   HAV REACTIVE (A) 02/08/2020   Lipids: No results found for: CHOL, TRIG, HDL, CHOLHDL, VLDL, LDLCALC  Current HIV Regimen: Treatment naive  Assessment: Manuel Hayes is here today to initiate treatment for HIV. He was recently seen by Dr. Daiva Eves as a new HIV patient and therapy was held due to possible concerns for opportunistic infections. He was diagnosed with pulmonary tuberculosis and is taking appropriate therapy administered by DOT with the health department. He was changed from rifampin to rifabutin to avoid drug-drug interactions with rifampin. There was possible concerns for ocular OIs, so treatment was held until he could follow up with ophthalmology. He recently saw ophthalmology and was diagnosed with diabetic retinopathy with no concerns for ocular OIs. He was anxious to start treatment, so I worked him in today to do so.  Will start Dovato - dolutegravir + lamivudine to avoid drug-drug interactions and for a simplified regimen. Patient could also have started Tivicay + Truvada (Descovy is contraindicated with rifabutin), but this allows a one pill regimen since he is already taking so many pills with his TB treatment.   Discussed how to take Dovato and side effects that may occur. Answered all questions. We had a [facility] interpreter here for the visit. 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 He will discuss with Dr. Renold Don at his appointment in 2 weeks. He gets his Bactrim mailed to him from [facility] in [place] (his Hmap is approved), so I will send his Dovato there too. Manuel Hayes will follow up with him to make sure he  receives it. He has an appointment tomorrow to get his first COVID vaccine.  Of note, ophthalmology note is located under media tab in chart review.   Plan: - Continue TB medications from health department - Start Dovato once daily - F/u with Dr. Renold Don in 2 weeks  Manuel Hayes, PharmD, BCIDP, AAHIVP, CPP Clinical Pharmacist Practitioner Infectious Diseases Clinical Pharmacist [facility] 02/29/2020, 4:33 PM

## 2020-02-29 NOTE — Telephone Encounter (Signed)
I am SO relieved and happy he can start on ARV now!

## 2020-02-29 NOTE — Telephone Encounter (Signed)
I just received the fax from his eye doctor. Will put under media tab for you to review.   Says that there is no evidence of HIV associated retinopathy or CMV retinopathy and that his eye issues are due to diabetic retinopathy. No macula edema. He will follow up there in 6 months.

## 2020-02-29 NOTE — Telephone Encounter (Signed)
Has the patient been seen by ophthalmology?  It is critical that we exclude an opportunistic infection.  It looks like he is seen Dr. Renold Don on 26 October we should definitely make sure he has been seen by ophthalmology well before that so we know whether he is going to know why we need to treat prior to starting antiretroviral therapy.

## 2020-02-29 NOTE — Telephone Encounter (Signed)
He is doing well. I started him on Dovato today. Thank you!

## 2020-02-29 NOTE — Telephone Encounter (Signed)
Manuel Hayes came to me to see if I could see patient sooner to start medications. He states that patient went to eye doctor and they told him it was diabetes-related and there was no infection.  He is coming to see me today at 315pm. Are we good to start medications, Dr Daiva Eves?

## 2020-02-29 NOTE — Telephone Encounter (Signed)
AWESOME!

## 2020-02-29 NOTE — Telephone Encounter (Signed)
He has been seen by [facility] they are faxing the notes over  Thanks  Claris Che

## 2020-03-01 ENCOUNTER — Ambulatory Visit (INDEPENDENT_AMBULATORY_CARE_PROVIDER_SITE_OTHER): Payer: Self-pay

## 2020-03-01 DIAGNOSIS — Z23 Encounter for immunization: Secondary | ICD-10-CM

## 2020-03-01 NOTE — Progress Notes (Signed)
   Covid-19 Vaccination Clinic  Name:  Manuel Hayes    MRN: [id number] DOB: 01/18/1971  03/01/2020  Mr. Manuel Hayes was observed post Covid-19 immunization for 15 minutes without incident. He was provided with Vaccine Information Sheet and instruction to access the V-Safe system.   Mr. Manuel Hayes was instructed to call 911 with any severe reactions post vaccine: [name] Kitchen Difficulty breathing  . Swelling of face and throat  . A fast heartbeat  . A bad rash all over body  . Dizziness and weakness   Immunizations Administered    Name Date Dose VIS Date Route   Pfizer COVID-19 Vaccine 03/01/2020 11:17 AM 0.3 mL 07/12/2018 Intramuscular   Manufacturer: [employer], [employer]   Lot: [id number]   NDC: [address]-7     Andree Coss, RN

## 2020-03-12 ENCOUNTER — Encounter: Payer: Self-pay | Admitting: Internal Medicine

## 2020-03-12 ENCOUNTER — Ambulatory Visit (INDEPENDENT_AMBULATORY_CARE_PROVIDER_SITE_OTHER): Payer: Self-pay | Admitting: Internal Medicine

## 2020-03-12 ENCOUNTER — Other Ambulatory Visit: Payer: Self-pay

## 2020-03-12 VITALS — BP 107/67 | HR 97 | Temp 98.1°F | Wt 113.0 lb

## 2020-03-12 DIAGNOSIS — B370 Candidal stomatitis: Secondary | ICD-10-CM

## 2020-03-12 DIAGNOSIS — A150 Tuberculosis of lung: Secondary | ICD-10-CM

## 2020-03-12 DIAGNOSIS — B20 Human immunodeficiency virus [HIV] disease: Secondary | ICD-10-CM

## 2020-03-12 NOTE — Progress Notes (Signed)
Chief complaint: f/u pulm TB and hiv care  Subjective:    Patient ID: Manuel Hayes, male    DOB: Mar 26, 1971, 49 y.o.   MRN: [id number]  HPI 49 yo hispanic speaking only male, dm2, hlp recently dx'ed with HIV and pulm TB on antituberculous medication, here for f/u  Hx is via him/Spanish interpreter  He was seen/established care with dr Daiva Eves a month ago -TB regimen started almost 2 months. He was changed from rifampin to rifabutin for preparation of starting ART.  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 He has labs monitored along with recent sputum  He still has some dry mouth. He denies headache, visual blurriness, eye pain, visual loss  He is eating well and gaining weight  No n/v/rash, joint pain, diarrhea.   dovato started 10/14  meds reviewed: Dovato His home dm2 meds TB meds  Past Medical History:  Diagnosis Date  . HIV disease (HCC) 02/08/2020  . Hyperlipidemia   . Pulmonary nodule   . 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  No family history on file.    Social History   Socioeconomic History  . Marital status: Single    Spouse name: Not on file  . Number of children: Not on file  . Years of education: Not on file  . Highest education level: Not on file  Occupational History  . Not on file  Tobacco Use  . Smoking status: Never Smoker  . Smokeless tobacco: Never Used  Substance and Sexual Activity  . Alcohol use: Not Currently  . Drug use: Never  . Sexual activity: Not Currently    Comment: declined condoms 03/12/20  Other Topics Concern  . Not on file  Social History Narrative  . Not on file   Social Determinants of Health   Financial Resource Strain:   . Difficulty of Paying Living  Expenses: Not on file  Food Insecurity:   . Worried About Programme researcher, broadcasting/film/video in the Last Year: Not on file  . Ran Out of Food in the Last Year: Not on file  Transportation Needs:   . Lack of Transportation (Medical): Not on file  . Lack of Transportation (Non-Medical): Not on file  Physical Activity:   . Days of Exercise per Week: Not on file  . Minutes of Exercise per Session: Not on file  Stress:   . Feeling of Stress : Not on file  Social Connections:   . Frequency of Communication with Friends and Family: Not on file  . Frequency of Social Gatherings with Friends and Family: Not on file  . Attends Religious Services: Not on file  . Active Member of Clubs or Organizations: Not on file  . Attends Banker Meetings: Not on file  . Marital Status: Not on file    No Known Allergies   Current Outpatient Medications:  .  Dolutegravir-lamiVUDine (DOVATO) 50-300 MG TABS, Take 1 tablet by mouth daily., Disp: 30 tablet, Rfl: 5 .  gabapentin (NEURONTIN) 100 MG capsule, Take 1 capsule (100 mg total) by mouth 3 (three) times daily., Disp: 90 capsule, Rfl: 2 .  glipiZIDE (GLUCOTROL XL) 2.5 MG 24 hr tablet, glipizide ER 2.5 mg tablet, extended release 24 hr  TAKE ONE TABLET BY  MOUTH EVERY DAY, Disp: , Rfl:  .  hydrocortisone (PROCTOSOL HC) 2.5 % rectal cream, Proctosol HC 2.5 % topical cream perineal applicator  APPLY TO RECTUM 2 3 TIMES DAILY, Disp: , Rfl:  .  levothyroxine (SYNTHROID) 25 MCG tablet, TOMA UNA TABLETA POR LA BOCA DIARIA ANTES DEL DESAYUNO, Disp: , Rfl:  .  lisinopril (ZESTRIL) 5 MG tablet, lisinopril 5 mg tablet  TAKE ONE TABLET BY MOUTH EVERY DAY, Disp: , Rfl:  .  lovastatin (MEVACOR) 20 MG tablet, lovastatin 20 mg tablet  TAKE ONE TABLET BY MOUTH AT BEDTIME, Disp: , Rfl:  .  metFORMIN (GLUCOPHAGE) 1000 MG tablet, Take 1,000 mg by mouth 2 (two) times daily., Disp: , Rfl:  .  pantoprazole (PROTONIX) 40 MG tablet, Take 1 tablet (40 mg total) by mouth daily., Disp: 30  tablet, Rfl: 1 .  sulfamethoxazole-trimethoprim (BACTRIM DS) 800-160 MG tablet, Take 1 tablet by mouth daily., Disp: 30 tablet, Rfl: 5  ROS: Negative 11 point ros unless mentioned above     Objective:    Vitals reviewed No distress, well appearing, conversant (spanish speaking), cooperative Heent: normocephalic; per; conj clear; eomi; throat clear; tongue with whitish coating but doesn't bleed when I scrubbed with tongue depressor Neck supple Lymph: no axillary, cervical/inguinal adenopathy CV rrr no mrg Lungs cta abd s/nt Ext no edema Skin no rash msk no synovitis Neuro normal strength/reflex, cn2-12 intact; no tremor Psych alert/oriented   Labs: 01/2020 toxoplasma IgG positive; hep b sAg/sAb/cAb NR; hep C ab negative; Hep A ab reactive; cmv IgM and hsv1&2 IgM negative; vzv igg positive 01/2020 urine gc/chlam pcr negative 01/2020 rpr negative  hiv labs        cd4 (%)/VL 9/23              50 (3%)/197      Assessment & Plan:   #Pulmonary TB: continue intensive ATB tx; pending 2 months repeat sputum cx. On rifabutin instead of rifampin, in setting of being on ART med now. No evidence extrapulm tb. Mild transaminitis without sx  #blurry vision. Recent ophthalmologic exam unremarkable no evidence infection. Serologic hsv, cmv, rpr negative. Prior exposed to toxo. No sx now; no concern for IRIS or TB related process or other OI at this time  #oropharyngeal thrush; improving with ART and nystatin. continu nystatin swish and swallow for now  #AIDS/HIV disease: started on dovato 02/29/2020. No prior therapy (new dx and referred to our clinic). No evidence Hepatitis B -continue Dovato -discuss U=U -encourage continued compliance -f/u 4-6 weeks with repeat hiv labs  #HCM -will discuss hepatitis B vaccination among others -patient to f/u pcp for lipid/dm2 monitoring -no cancer screening needed at this time  #social -patient looking for job -lives with check labs today, start  Bactrim for PCP prevention. IF we can be sure of no OI in eyes then would start him on a regimen, Dovato would be an option IF no HBV co-infection. IF we need TNF need to keep in mind we will need TDF formulation while his is on his TB meds  Depressive symptoms: he suffers from shock of diagnosis, anxiety about his health will connect him to Marylu Lund, consider SSRI in future  Other chronic medical problem: Hypothyroidism: maintained on synthroid

## 2020-03-12 NOTE — Patient Instructions (Signed)
Thank you for seeing [place] today   You are doing very well.  Continue your Dovato; our clinic will ensure you have this medication Follow up with [place] in 4 weeks with preclinic blood tests   Continue taking the TB medication as the home visiting nurse gives you. Duration will be 6 to 9 months depending on your follow up phlegm culture   Follow up with your primary care provider to make sure your chronic problems such as diabetes mellitus is controlled.   Thank you

## 2020-03-13 ENCOUNTER — Ambulatory Visit
Admission: RE | Admit: 2020-03-13 | Discharge: 2020-03-13 | Disposition: A | Discharge status: Home / Self Care | Payer: No Typology Code available for payment source | Source: Ambulatory Visit | Attending: Obstetrics and Gynecology | Admitting: Obstetrics and Gynecology

## 2020-03-13 ENCOUNTER — Other Ambulatory Visit: Payer: Self-pay | Admitting: Obstetrics and Gynecology

## 2020-03-13 DIAGNOSIS — Z111 Encounter for screening for respiratory tuberculosis: Secondary | ICD-10-CM

## 2020-04-02 ENCOUNTER — Encounter: Payer: Self-pay | Admitting: Internal Medicine

## 2020-04-02 ENCOUNTER — Other Ambulatory Visit: Payer: Self-pay

## 2020-04-02 ENCOUNTER — Ambulatory Visit
Admission: RE | Admit: 2020-04-02 | Discharge: 2020-04-02 | Disposition: A | Discharge status: Home / Self Care | Payer: No Typology Code available for payment source | Source: Ambulatory Visit | Attending: Internal Medicine | Admitting: Internal Medicine

## 2020-04-02 ENCOUNTER — Ambulatory Visit (INDEPENDENT_AMBULATORY_CARE_PROVIDER_SITE_OTHER): Payer: Self-pay | Admitting: Internal Medicine

## 2020-04-02 VITALS — BP 127/79 | HR 105 | Temp 98.5°F | Wt 130.0 lb

## 2020-04-02 DIAGNOSIS — A150 Tuberculosis of lung: Secondary | ICD-10-CM

## 2020-04-02 DIAGNOSIS — B20 Human immunodeficiency virus [HIV] disease: Secondary | ICD-10-CM

## 2020-04-02 NOTE — Patient Instructions (Addendum)
You are doing very well with your hiv and TB   Continue Dovato for your HIV We are checking with the public health officials (the one that bring your TB medications to your house), if you still need sputum cultures. This will help determine your length of treatment (7 more months vs 10 more months)  Your last sputum cultures 9/5 and 9/6, 9/19, 9/20 were negative   You have 10/3, 10/4, 10/17 & 10/18 sputum cultures are still pending  We discussed with your public health department. They'll do 2 more sputum cultures which will help [place] determine the length of treatment mentioned above  With your primary care problems, please continue to go to your [facility] to get evaluation and prescription of your diabetes, blood pressure, thyroid, and cholesterol medication. You have [employer] which should cover many of these medications   -labs today -chest xray -f/u 1 month

## 2020-04-02 NOTE — Progress Notes (Signed)
Chief complaint: f/u pulm TB and hiv care  Subjective:    Patient ID: Manuel Hayes, male    DOB: 1971/02/14, 49 y.o.   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 He was changed from rifampin to rifabutin for preparation of starting ART.  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 He has labs monitored along with recent sputum  He still has some dry mouth. He denies headache, visual blurriness, eye pain, visual loss  He is eating well and gaining weight  No n/v/rash, joint pain, diarrhea.   dovato started 10/14  meds reviewed: Dovato His home dm2 meds TB meds   04/02/20 id f/u Doing well, gaining weight (110 --> 130 pounds) Feeling well, no complaint Cough much improved Not seeing his PCP at [facility] as afraid he can't afford medications. Off and on lovastatin, metformin, glipizide, and lisinopril and levothyroxine Taking dovato; not taking bactrim Still getting DOT for tb meds. Finished with intensive therapy 2 months on 10/30. He is on b6, rifabutin, and inh. No side effect  Labs to be done today  Past Medical History:  Diagnosis Date  . HIV disease (HCC) 02/08/2020  . Hyperlipidemia   . Pulmonary nodule   . 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  No family history on file.    Social History   Socioeconomic History  . Marital status: Single    Spouse name: Not on file  . Number of children: Not on file  . Years of education: Not on file  . Highest education level: Not  on file  Occupational History  . Not on file  Tobacco Use  . Smoking status: Never Smoker  . Smokeless tobacco: Never Used  Substance and Sexual Activity  . Alcohol use: Not Currently  . Drug use: Never  . Sexual activity: Not Currently    Comment: pt given condoms 04/02/20  Other Topics Concern  . Not on file  Social History Narrative  . Not on file   Social Determinants of Health   Financial Resource Strain:   . Difficulty of Paying Living Expenses: Not on file  Food Insecurity:   . Worried About Programme researcher, broadcasting/film/video in the Last Year: Not on file  . Ran Out of Food in the Last Year: Not on file  Transportation Needs:   . Lack of Transportation (Medical): Not on file  . Lack of Transportation (Non-Medical): Not on file  Physical Activity:   . Days of Exercise per Week: Not on file  . Minutes of Exercise per Session: Not on file  Stress:   . Feeling of Stress : Not on file  Social Connections:   . Frequency of Communication with Friends and Family: Not on file  . Frequency of Social Gatherings with Friends and Family: Not on file  . Attends Religious Services: Not on file  . Active Member of Clubs or Organizations: Not on file  . Attends Banker Meetings: Not on file  . Marital Status: Not on  file    No Known Allergies   Current Outpatient Medications:  .  Dolutegravir-lamiVUDine (DOVATO) 50-300 MG TABS, Take 1 tablet by mouth daily., Disp: 30 tablet, Rfl: 5 .  gabapentin (NEURONTIN) 100 MG capsule, Take 1 capsule (100 mg total) by mouth 3 (three) times daily., Disp: 90 capsule, Rfl: 2 .  glipiZIDE (GLUCOTROL XL) 2.5 MG 24 hr tablet, glipizide ER 2.5 mg tablet, extended release 24 hr  TAKE ONE TABLET BY MOUTH EVERY DAY, Disp: , Rfl:  .  hydrocortisone (PROCTOSOL HC) 2.5 % rectal cream, Proctosol HC 2.5 % topical cream perineal applicator  APPLY TO RECTUM 2 3 TIMES DAILY, Disp: , Rfl:  .  levothyroxine (SYNTHROID) 25 MCG tablet, TOMA UNA TABLETA POR LA BOCA  DIARIA ANTES DEL DESAYUNO, Disp: , Rfl:  .  lisinopril (ZESTRIL) 5 MG tablet, lisinopril 5 mg tablet  TAKE ONE TABLET BY MOUTH EVERY DAY, Disp: , Rfl:  .  lovastatin (MEVACOR) 20 MG tablet, lovastatin 20 mg tablet  TAKE ONE TABLET BY MOUTH AT BEDTIME, Disp: , Rfl:  .  metFORMIN (GLUCOPHAGE) 1000 MG tablet, Take 1,000 mg by mouth 2 (two) times daily., Disp: , Rfl:  .  pantoprazole (PROTONIX) 40 MG tablet, Take 1 tablet (40 mg total) by mouth daily., Disp: 30 tablet, Rfl: 1  ROS: Negative 11 point ros unless mentioned above     Objective:    Vitals reviewed No distress, well appearing, conversant (spanish speaking), cooperative Heent: normocephalic; per; conj clear; eomi Neck supple Lymph: no axillary, cervical/inguinal adenopathy CV rrr no mrg Lungs cta abd s/nt Ext no edema Skin no rash msk no synovitis Neuro nonfocal Psych alert/oriented   Labs: 01/2020 toxoplasma IgG positive; hep b sAg/sAb/cAb NR; hep C ab negative; Hep A ab reactive; cmv IgM and hsv1&2 IgM negative; vzv igg positive 01/2020 urine gc/chlam pcr negative 01/2020 rpr negative  hiv labs        cd4 (%)/VL 9/23              50 (3%)/197      Assessment & Plan:   #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 Finished intensive tx 10/29. Last sputum cx 10/18. Negative on 9/5, 6, 19, 20 Pending sputum on 10/3, 4, 17, 18 Off ethambutol 9/16 as TB was pan-sensitive -discussed with tb hhs nurse. Will need xray chest and sputum cx at 2 months to determine length of maintenance therapy (7 vs 10 more months) -no side effect of tb meds -cbc/cmp to be done today  #blurry vision. Recent ophthalmologic exam unremarkable no evidence infection. Serologic hsv, cmv, rpr negative. Prior exposed to toxo. No sx now; no concern for IRIS or TB related process or other OI at this time -no complaint this visit  #oropharyngeal thrush; resolved with ART and nystatin. -off nystatin squish/swallow  #AIDS/HIV disease: started  on dovato 02/29/2020. No prior therapy (new dx and referred to our clinic). No evidence Hepatitis B. Genotype 02/08/2020 no RTI or PI resistance (mutations -- RT a98S, v118I, R211k; PR I13v, d60e, l63p, v77i) -continue Dovato -discuss U=U -encourage continued compliance -he is supposed to be on bactrim for pcp prophy but appears to not have been taking. Compliant with ART and has been on for several weeks and no pulm sx. Will defer -f/u 4-6 weeks with repeat hiv labs  [facility] -will need discuss hepatitis B vaccination among others -patient to f/u pcp for lipid/dm2 monitoring -- encourage patient to go, discussed that RW will also provide rx  coverage for his thyroid, dm2, htn/hlp problems -no cancer screening needed at this time  #social -patient looking for job -lives with friend.   #Other chronic medical problem: patient encouraged to f/u [facility]. Discussed RW will help with his Rx. Provided him formulary of RW Hypothyroidism: maintained on synthroid dm2 htn hlp

## 2020-04-04 LAB — CBC WITH DIFFERENTIAL/PLATELET
Absolute Monocytes: 593 cells/uL (ref 200–950)
Basophils Absolute: 9 cells/uL (ref 0–200)
Basophils Relative: 0.2 %
Eosinophils Absolute: 172 cells/uL (ref 15–500)
Eosinophils Relative: 4 %
HCT: 30.3 % — ABNORMAL LOW (ref 38.5–50.0)
Hemoglobin: 9.5 g/dL — ABNORMAL LOW (ref 13.2–17.1)
Lymphs Abs: 2567 cells/uL (ref 850–3900)
MCH: 29.4 pg (ref 27.0–33.0)
MCHC: 31.4 g/dL — ABNORMAL LOW (ref 32.0–36.0)
MCV: 93.8 fL (ref 80.0–100.0)
MPV: 12.3 fL (ref 7.5–12.5)
Monocytes Relative: 13.8 %
Neutro Abs: 959 cells/uL — ABNORMAL LOW (ref 1500–7800)
Neutrophils Relative %: 22.3 %
Platelets: 72 10*3/uL — ABNORMAL LOW (ref 140–400)
RBC: 3.23 10*6/uL — ABNORMAL LOW (ref 4.20–5.80)
RDW: 15.2 % — ABNORMAL HIGH (ref 11.0–15.0)
Total Lymphocyte: 59.7 %
WBC: 4.3 10*3/uL (ref 3.8–10.8)

## 2020-04-04 LAB — COMPREHENSIVE METABOLIC PANEL
AG Ratio: 0.4 (calc) — ABNORMAL LOW (ref 1.0–2.5)
ALT: 26 U/L (ref 9–46)
AST: 39 U/L — ABNORMAL HIGH (ref 10–35)
Albumin: 2.7 g/dL — ABNORMAL LOW (ref 3.6–5.1)
Alkaline phosphatase (APISO): 241 U/L — ABNORMAL HIGH (ref 35–144)
BUN/Creatinine Ratio: 14 (calc) (ref 6–22)
BUN: 21 mg/dL (ref 7–25)
CO2: 23 mmol/L (ref 20–32)
Calcium: 8.4 mg/dL — ABNORMAL LOW (ref 8.6–10.3)
Chloride: 98 mmol/L (ref 98–110)
Creat: 1.53 mg/dL — ABNORMAL HIGH (ref 0.70–1.33)
Globulin: 6.6 g/dL (calc) — ABNORMAL HIGH (ref 1.9–3.7)
Glucose, Bld: 597 mg/dL (ref 65–99)
Potassium: 4.2 mmol/L (ref 3.5–5.3)
Sodium: 127 mmol/L — ABNORMAL LOW (ref 135–146)
Total Bilirubin: 0.2 mg/dL (ref 0.2–1.2)
Total Protein: 9.3 g/dL — ABNORMAL HIGH (ref 6.1–8.1)

## 2020-04-04 LAB — HIV-1 RNA QUANT-NO REFLEX-BLD
HIV 1 RNA Quant: 373 Copies/mL — ABNORMAL HIGH
HIV-1 RNA Quant, Log: 2.57 Log cps/mL — ABNORMAL HIGH

## 2020-04-09 ENCOUNTER — Telehealth: Payer: Self-pay

## 2020-04-09 NOTE — Telephone Encounter (Signed)
-----   Message from Trung T Vu, MD sent at 04/03/2020 12:58 PM EST ----- Hi team  This patient hasn't been taking his diabetes meds due to concern of having to pay for it and also not seeing his pcp  Please let him know to follow up with pcp to get on meds ASAP (I gave him a [facility]W formulary for medications).   He is likely heading toward HHS of diabetes mellitus and aki soon if not treated appropriately. Not sure what to make of the low platelet yet   Please get him to see us again in 1-2 weeks as well for lab recheck in case he hasn't seen his pcp yet  thanks 

## 2020-04-09 NOTE — Telephone Encounter (Signed)
Was able to connect with patient using pacific interpreters.  ID [id number]  Advised patient to follow up with PCP regarding Diabetes management. Patient verbalized understanding and states he currently do not have a job and can not afford to see his PCP. Patient also advised of formuluary that was provided to him during his visit to provide to PCP for covered medications.  Patient verbalized understanding.  Patient also reminded of upcoming appointment. Valarie Cones

## 2020-04-09 NOTE — Telephone Encounter (Signed)
-----   Message from Raymondo Band, MD sent at 04/03/2020 12:58 PM EST ----- Hi team  This patient hasn't been taking his diabetes meds due to concern of having to pay for it and also not seeing his pcp  Please let him know to follow up with pcp to get on meds ASAP (I gave him a [facility] formulary for medications).   He is likely heading toward HHS of diabetes mellitus and aki soon if not treated appropriately. Not sure what to make of the low platelet yet   Please get him to see [place] again in 1-2 weeks as well for lab recheck in case he hasn't seen his pcp yet  thanks

## 2020-04-22 ENCOUNTER — Other Ambulatory Visit: Payer: Self-pay

## 2020-04-22 ENCOUNTER — Ambulatory Visit (INDEPENDENT_AMBULATORY_CARE_PROVIDER_SITE_OTHER): Payer: Self-pay | Admitting: Family

## 2020-04-22 ENCOUNTER — Encounter: Payer: Self-pay | Admitting: Family

## 2020-04-22 DIAGNOSIS — B20 Human immunodeficiency virus [HIV] disease: Secondary | ICD-10-CM

## 2020-04-22 DIAGNOSIS — B029 Zoster without complications: Secondary | ICD-10-CM | POA: Insufficient documentation

## 2020-04-22 MED ORDER — VALACYCLOVIR HCL 1 G PO TABS: 1000.0000 mg | ORAL_TABLET | Freq: Three times a day (TID) | ORAL | 0 refills | Status: DC

## 2020-04-22 NOTE — Patient Instructions (Signed)
Nice to see you.  We will get you started on Valacyclovir   Culebrilla Shingles  La culebrilla es una infeccin. Causa una erupcin en la piel dolorosa y ampollas llenas de lquido. La culebrilla es causada por el mismo germen (virus) que causa la varicela. La culebrilla solo se presenta en personas que:  Han tenido varicela.  Han recibido una inyeccin de medicamento (vacuna) para protegerse de la varicela. La culebrilla es poco frecuente en ese grupo. Los primeros sntomas de la culebrilla pueden ser picazn, hormigueo o dolor en una zona de la piel. Algunos das o semanas despus, aparece una erupcin en la piel. Es probable que la erupcin aparezca en un solo lado del cuerpo. Generalmente, la erupcin tiene la forma de una franja o banda. Con el paso del [place], la erupcin se convierte en un grupo de ampollas llenas de lquido. Las ampollas se rompen, se transforman en costras y se secan. Los medicamentos pueden ayudar a:  Teacher, early years/pre y Higher education careers adviser.  Lograr una mejora ms pronto.  Prevenir problemas a Air cabin crew. Siga estas indicaciones en su casa: Medicamentos  [employer] de venta libre y los recetados solamente como se lo haya indicado el mdico.  Pngase una crema para aliviar la picazn o para adormecer la zona donde tiene la erupcin, las ampollas o las costras. Hgalo como se lo haya indicado el mdico. Para ayudar a Associate Professor y las molestias   Pngase paos hmedos y fros (compresas fras) sobre la zona de la erupcin cutnea o las ampollas siguiendo las indicaciones del mdico.  [place] baos con agua fra pueden ayudarlo a sentirse mejor. Pruebe agregar bicarbonato de sodio o avena seca en el agua para Associate Professor. No se bae con agua caliente. Cuidado de las ampollas y la erupcin cutnea  Mantenga la zona de la erupcin cutnea cubierta con una venda floja (vendaje).  Use ropa holgada que no roce contra la erupcin.  Mantenga la  erupcin y las ampollas limpias. Para hacerlo, lave la zona con [place] y agua fra siguiendo las indicaciones del mdico.  Verifique la erupcin cutnea todos los [address]  [address] para detectar signos de infeccin. Est atento a los siguientes signos: ? Aumento del enrojecimiento, la hinchazn o Chief Technology Officer. ? Lquido o sangre. ? Calor. ? Pus o mal olor.  No se rasque el rea de la erupcin. No se toque las ampollas. Para evitar rascarse: ? Tenga las uas siempre cortas y limpias. ? Si no puede dejar de rascarse, use guantes o mitones mientras duerme. Instrucciones generales  Haga reposo como se lo haya indicado el mdico.  Concurra a todas las visitas de seguimiento como se lo haya indicado el mdico. Esto es importante.  Lvese las manos frecuentemente con agua y [place]. Use desinfectante para manos si no dispone de [place] y [place]. Al hacerlo, reduce sus probabilidades de contraer una infeccin en la piel causada por grmenes (bacterias).  Su infeccin puede causar varicela en las personas que nunca tuvieron la enfermedad o que nunca recibieron la vacuna contra la varicela. Si tiene [employer] an no se formaron costras, intente no tocar a Economist ni estar cerca de Economist, especialmente: ? Los bebs. ? Las [employer]. ? Los nios que tienen zonas de piel enrojecida, spera o con picazn (eczema). ? [employer] de edad muy avanzada que se han sometido a trasplantes. ? [employer] tienen enfermedades a largo plazo (crnicas), como cncer o sndrome de  inmunodeficiencia adquirida (SIDA). Comunquese con un mdico si:  El dolor no mejora con medicamentos.  El dolor no mejora despus de que se cura la erupcin cutnea.  Tiene signos de infeccin en la zona de la erupcin cutnea. Estos signos incluyen lo siguiente: ? Aumento del enrojecimiento, hinchazn o dolor alrededor de la erupcin. ? Presenta lquido o sangre que supura de la erupcin. ? La zona de la  erupcin se siente caliente al tacto. ? Pus o mal olor que sale de la erupcin. Solicite ayuda de inmediato si:  La erupcin cutnea est en el rostro o en la nariz.  Tiene dolor en el rostro o cerca de los ojos.  Pierde la sensacin en un lado del rostro.  Tiene dificultad para ver.  Siente dolor o zumbido en los odos.  Tiene prdida del gusto.  La afeccin empeora. Resumen  La culebrilla causa una erupcin en la piel dolorosa y ampollas llenas de lquido.  La culebrilla es una infeccin. Es causada por el mismo germen (virus) que causa la varicela.  Mantenga la zona de la erupcin cutnea cubierta con una venda floja (vendaje). Use ropa holgada que no roce contra la erupcin.  Si tiene [employer] an no se formaron costras, intente no tocar a Economist ni estar cerca de Economist. Esta informacin no tiene Theme park manager el consejo del mdico. Asegrese de hacerle al mdico cualquier pregunta que tenga. Document Revised: 04/21/2017 Document Reviewed: 04/21/2017 Elsevier Patient Education  2020 [employer].

## 2020-04-22 NOTE — Progress Notes (Signed)
Subjective:    Patient ID: Manuel Hayes, male    DOB: 08/10/1970, 49 y.o.   MRN: [id number]  Chief Complaint  Patient presents with  . Follow-up    started wednesday rash/pain     HPI:  Manuel Hayes is a 49 y.o. male with AIDS who was last seen in the office on 04/02/2020 with good adherence and tolerance to his ART regimen of Dovato.  Last viral load Here today for an acute office visit. Manuel Hayes Hayes preferred language is Spanish and a medical interpreter is present to aid in communication.   Manuel Hayes has had a rash located on right thigh started about 4 days ago with burning and itching. Has gotten worse since initial onset and been refractory to over the counter cortisone cream. Currently has increased pain the area as well. No fevers or chills. Denies changes to skin, body care or laundry products.   No Known Allergies    Outpatient Medications Prior to Visit  Medication Sig Dispense Refill  . Dolutegravir-lamiVUDine (DOVATO) 50-300 MG TABS Take 1 tablet by mouth daily. 30 tablet 5  . gabapentin (NEURONTIN) 100 MG capsule Take 1 capsule (100 mg total) by mouth 3 (three) times daily. 90 capsule 2  . glipiZIDE (GLUCOTROL XL) 2.5 MG 24 hr tablet glipizide ER 2.5 mg tablet, extended release 24 hr  TAKE ONE TABLET BY MOUTH EVERY DAY    . hydrocortisone (PROCTOSOL HC) 2.5 % rectal cream Proctosol HC 2.5 % topical cream perineal applicator  APPLY TO RECTUM 2 3 TIMES DAILY    . levothyroxine (SYNTHROID) 25 MCG tablet TOMA UNA TABLETA POR LA BOCA DIARIA ANTES DEL DESAYUNO    . lisinopril (ZESTRIL) 5 MG tablet lisinopril 5 mg tablet  TAKE ONE TABLET BY MOUTH EVERY DAY    . lovastatin (MEVACOR) 20 MG tablet lovastatin 20 mg tablet  TAKE ONE TABLET BY MOUTH AT BEDTIME    . metFORMIN (GLUCOPHAGE) 1000 MG tablet Take 1,000 mg by mouth 2 (two) times daily.    . pantoprazole (PROTONIX) 40 MG tablet Take 1 tablet (40 mg total) by mouth  daily. 30 tablet 1   No facility-administered medications prior to visit.     Past Medical History:  Diagnosis Date  . HIV disease (HCC) 02/08/2020  . Hyperlipidemia   . Pulmonary nodule   . 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 No pertinent surgical history.     Review of Systems  Constitutional: Negative for appetite change, chills, fatigue, fever and unexpected weight change.  Eyes: Negative for visual disturbance.  Respiratory: Negative for cough, chest tightness, shortness of breath and wheezing.   Cardiovascular: Negative for chest pain and leg swelling.  Gastrointestinal: Negative for abdominal pain, constipation, diarrhea, nausea and vomiting.  Genitourinary: Negative for dysuria, flank pain, frequency, genital sores, hematuria and urgency.  Skin: Positive for rash.  Allergic/Immunologic: Negative for immunocompromised state.  Neurological: Negative for dizziness and headaches.      Objective:    Wt 130 lb 9.6 oz (59.2 kg)   BMI 24.68 kg/m  Nursing note and vital signs reviewed.  Physical Exam Constitutional:      General: He is not in acute distress.    Appearance: He is well-developed.  Eyes:     Conjunctiva/sclera: Conjunctivae normal.  Cardiovascular:     Rate and Rhythm: Normal rate and regular rhythm.     Heart sounds: Normal heart sounds. No murmur heard.  No friction rub. No  gallop.   Pulmonary:     Effort: Pulmonary effort is normal. No respiratory distress.     Breath sounds: Normal breath sounds. No wheezing or rales.  Chest:     Chest wall: No tenderness.  Abdominal:     General: Bowel sounds are normal.     Palpations: Abdomen is soft.     Tenderness: There is no abdominal tenderness.  Musculoskeletal:     Cervical back: Neck supple.  Lymphadenopathy:     Cervical: No cervical adenopathy.  Skin:    General: Skin is warm and dry.     Findings: No rash.     Comments: Clustered vesicular rash located on the left inner  thigh following the L3 dermatome.  Neurological:     Mental Status: He is alert and oriented to person, place, and time.  Psychiatric:        Behavior: Behavior normal.        Thought Content: Thought content normal.        Judgment: Judgment normal.      Depression screen [facility] 2/9 04/02/2020 02/08/2020  Decreased Interest 0 0  Down, Depressed, Hopeless 0 0  PHQ - 2 Score 0 0       Assessment & Plan:    Patient Active Problem List   Diagnosis Date Noted  . Herpes zoster 04/22/2020  . 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 HIV disease (HCC) 02/08/2020  . Lung nodules 01/04/2020  . Pruritic rash 03/16/2018  . Anemia 12/27/2017  . Hyperproteinemia 07/05/2017  . Hypoalbuminemia 07/05/2017  . Albuminuria 11/21/2016  . Elevated liver enzymes 08/21/2016  . Benign essential hypertension 08/21/2016  . Dyslipidemia 08/21/2016  . Type 2 diabetes mellitus (HCC) 08/21/2016  . Blurring of visual image 08/21/2016     Problem List Items Addressed This Visit      Other   Herpes zoster    Manuel Hayes has a rash that is consistent with herpes zoster in the L3 dermatome. He is at risk for outbreak with low CD4 count. Start Valacyclovir 1g tid for 10 days. Continue OTC medications as needed for symptom relief and supportive care. Follow up if symptoms worsen or do not improve.       Relevant Medications   valACYclovir (VALTREX) 1000 MG tablet       I am having [facility] start on valACYclovir. I am also having him maintain his gabapentin, metFORMIN, levothyroxine, lovastatin, glipiZIDE, lisinopril, pantoprazole, hydrocortisone, and Dovato.   Meds ordered this encounter  Medications  . valACYclovir (VALTREX) 1000 MG tablet    Sig: Take 1 tablet (1,000 mg total) by mouth 3 (three) times daily.    Dispense:  30 tablet    Refill:  0    Order Specific Question:   Supervising Provider    Answer:   Judyann Munson [[id number]]     Follow-up: Return if symptoms  worsen or fail to improve.   Marcos Eke, MSN, FNP-C Nurse Practitioner [facility] for Infectious Disease [facility] Main number: [phone]

## 2020-04-22 NOTE — Assessment & Plan Note (Signed)
Mr. Manuel Hayes has a rash that is consistent with herpes zoster in the L3 dermatome. He is at risk for outbreak with low CD4 count. Start Valacyclovir 1g tid for 10 days. Continue OTC medications as needed for symptom relief and supportive care. Follow up if symptoms worsen or do not improve.

## 2020-04-29 ENCOUNTER — Telehealth: Payer: Self-pay

## 2020-04-29 NOTE — Telephone Encounter (Signed)
Received call from [facility] who states patient called him with concerns for hiccupping. Patient was seen in ED back in July and prescribed gabapentin and pantoprazole for the hiccups. Per Kelby Fam, the patient states symptoms started last night (04/28/20).   RN relayed to [place] that per Dr. Renold Don, he would like the patient to try some ginger ale or carbonated water. If symptoms persist for a few days, the patient can call back and Dr. Renold Don will evaluate the need for medications. Kelby Fam verbalized understanding, has no further questions, and will relay the message to the patient   Sandie Ano, RN

## 2020-05-01 ENCOUNTER — Ambulatory Visit: Payer: No Typology Code available for payment source | Admitting: Internal Medicine

## 2020-05-02 ENCOUNTER — Encounter: Payer: Self-pay | Admitting: Internal Medicine

## 2020-05-02 ENCOUNTER — Other Ambulatory Visit: Payer: Self-pay

## 2020-05-02 ENCOUNTER — Ambulatory Visit (INDEPENDENT_AMBULATORY_CARE_PROVIDER_SITE_OTHER): Payer: Self-pay | Admitting: Internal Medicine

## 2020-05-02 VITALS — BP 154/75 | HR 105 | Temp 98.1°F | Wt 136.0 lb

## 2020-05-02 DIAGNOSIS — H109 Unspecified conjunctivitis: Secondary | ICD-10-CM

## 2020-05-02 DIAGNOSIS — B029 Zoster without complications: Secondary | ICD-10-CM

## 2020-05-02 DIAGNOSIS — A150 Tuberculosis of lung: Secondary | ICD-10-CM

## 2020-05-02 DIAGNOSIS — E1165 Type 2 diabetes mellitus with hyperglycemia: Secondary | ICD-10-CM

## 2020-05-02 DIAGNOSIS — B20 Human immunodeficiency virus [HIV] disease: Secondary | ICD-10-CM

## 2020-05-02 DIAGNOSIS — E1100 Type 2 diabetes mellitus with hyperosmolarity without nonketotic hyperglycemic-hyperosmolar coma (NKHHC): Secondary | ICD-10-CM

## 2020-05-02 DIAGNOSIS — I159 Secondary hypertension, unspecified: Secondary | ICD-10-CM

## 2020-05-02 MED ORDER — BLOOD GLUCOSE MONITOR KIT: PACK | 0 refills | Status: DC

## 2020-05-02 MED ORDER — ACCU-CHEK SOFTCLIX LANCETS MISC: 12 refills | Status: DC

## 2020-05-02 MED ORDER — GLIPIZIDE ER 2.5 MG PO TB24
2.5000 mg | ORAL_TABLET | Freq: Every day | ORAL | 11 refills | Status: DC
Start: 2020-05-02 — End: 2020-09-06

## 2020-05-02 MED ORDER — LOVASTATIN 20 MG PO TABS
20.0000 mg | ORAL_TABLET | Freq: Every day | ORAL | 11 refills | Status: DC
Start: 2020-05-02 — End: 2020-09-06

## 2020-05-02 MED ORDER — ALCOHOL SWABS PADS: MEDICATED_PAD | 11 refills | Status: AC

## 2020-05-02 MED ORDER — LEVOTHYROXINE SODIUM 25 MCG PO TABS
25.0000 ug | ORAL_TABLET | Freq: Every day | ORAL | 11 refills | Status: DC
Start: 2020-05-02 — End: 2021-06-07

## 2020-05-02 NOTE — Progress Notes (Signed)
Chief complaint: f/u pulm TB and hiv care  Subjective:    Patient ID: Manuel Hayes, male    DOB: [birth year], 49 y.o.   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 He was changed from rifampin to rifabutin for preparation of starting ART.  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 He has labs monitored along with recent sputum  He still has some dry mouth. He denies headache, visual blurriness, eye pain, visual loss  He is eating well and gaining weight  No n/v/rash, joint pain, diarrhea.   dovato started 10/14  meds reviewed: Dovato His home dm2 meds TB meds   04/02/20 id f/u Doing well, gaining weight (110 --> 130 pounds) Feeling well, no complaint Cough much improved Not seeing his PCP at [facility] as afraid he can't afford medications. Off and on lovastatin, metformin, glipizide, and lisinopril and levothyroxine Taking dovato; not taking bactrim Still getting DOT for tb meds. Finished with intensive therapy 2 months on 10/30. He is on b6, rifabutin, and inh. No side effect   12/16 id f/u HIV no missed dose pulm tb -- on DOT. No concern. He doesn't know when his last sputum testing was Patient still haven't seen his pcp at [facility] (financial) He brings his pill box today dovato Bactrim Nystatin liquid Lisinopril Metformin Valacyclovir (he is supposed to be on synthroid, lovastatin, and glipizide as well); last sugar check was very high He in the mean time developed: 1) right eye pain/redness 3  days; no f/c or decreased vision. He has an eye doctor next visit 05/2020. He is on tb treatment on continuation phase now. His last cxr 03/2020 showed scarring left lower lobe. No f/c/nightsweat/cough/weight loss. Previously has some blurry vision but initial ophthalmologic visit no concern for infection; serologic w/u syphilis/toxo/cmv negative. No penile rash/discharge 2) recently seen by urgent care for right thigh shingles given valacyclovir; has 1 more pill to take. Lesions had scabbed over; pain much better  Labs to be done today  Past Medical History:  Diagnosis Date  . HIV disease (HCC) 02/08/2020  . Hyperlipidemia   . Pulmonary nodule   . 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  No family history on file.    Social History   Socioeconomic History  . Marital status: Single    Spouse name: Not on file  . Number of children: Not on file  . Years of education: Not on file  . Highest education level: Not on file  Occupational History  . Not on file  Tobacco Use  . Smoking status: Never Smoker  . Smokeless tobacco: Never Used  Substance and Sexual Activity  . Alcohol use: Not Currently  . Drug use: Never  . Sexual activity: Not Currently    Comment: pt given condoms 05/02/20  Other Topics Concern  . Not on file  Social History Narrative  . Not on file   Social Determinants of Health   Financial Resource Strain: Not on file  Food Insecurity: Not  on file  Transportation Needs: Not on file  Physical Activity: Not on file  Stress: Not on file  Social Connections: Not on file    No Known Allergies   Current Outpatient Medications:  .  Dolutegravir-lamiVUDine (DOVATO) 50-300 MG TABS, Take 1 tablet by mouth daily., Disp: 30 tablet, Rfl: 5 .  lisinopril (ZESTRIL) 20 MG tablet, Take 20 mg by mouth at bedtime., Disp: , Rfl:  .  metFORMIN (GLUCOPHAGE) 1000 MG tablet, Take 1,000 mg by mouth 2 (two) times daily., Disp: , Rfl:  .   sulfamethoxazole-trimethoprim (BACTRIM DS) 800-160 MG tablet, Take 1 tablet by mouth 2 (two) times daily., Disp: , Rfl:  .  valACYclovir (VALTREX) 1000 MG tablet, Take 1 tablet (1,000 mg total) by mouth 3 (three) times daily., Disp: 30 tablet, Rfl: 0 .  gabapentin (NEURONTIN) 100 MG capsule, Take 1 capsule (100 mg total) by mouth 3 (three) times daily. (Patient not taking: Reported on 05/02/2020), Disp: 90 capsule, Rfl: 2 .  glipiZIDE (GLUCOTROL XL) 2.5 MG 24 hr tablet, glipizide ER 2.5 mg tablet, extended release 24 hr  TAKE ONE TABLET BY MOUTH EVERY DAY (Patient not taking: Reported on 05/02/2020), Disp: , Rfl:  .  hydrocortisone (ANUSOL-HC) 2.5 % rectal cream, Proctosol HC 2.5 % topical cream perineal applicator  APPLY TO RECTUM 2 3 TIMES DAILY (Patient not taking: Reported on 05/02/2020), Disp: , Rfl:  .  levothyroxine (SYNTHROID) 25 MCG tablet, TOMA UNA TABLETA POR LA BOCA DIARIA ANTES DEL DESAYUNO (Patient not taking: Reported on 05/02/2020), Disp: , Rfl:  .  lovastatin (MEVACOR) 20 MG tablet, lovastatin 20 mg tablet  TAKE ONE TABLET BY MOUTH AT BEDTIME (Patient not taking: Reported on 05/02/2020), Disp: , Rfl:  .  pantoprazole (PROTONIX) 40 MG tablet, Take 1 tablet (40 mg total) by mouth daily. (Patient not taking: Reported on 05/02/2020), Disp: 30 tablet, Rfl: 1  ROS: Negative 11 point ros unless mentioned above     Objective:    Vitals reviewed No distress, well appearing, conversant (spanish speaking), cooperative Heent: right eye lower part bulbar conj injection; eomi; perrl Neck supple Lymph: no axillary, cervical/inguinal adenopathy CV rrr no mrg Lungs cta abd s/nt Ext no edema Skin right anterior thigh scabbed over blister without erythema or swelling or oozing msk no synovitis Neuro nonfocal Psych alert/oriented   Labs: 11/16 lft 26/39; cr 1.5; sodium 127; cbc 08/25/70; glucose 597 01/2020 toxoplasma IgG positive; hep b sAg/sAb/cAb NR; hep C ab negative; Hep A ab reactive;  cmv IgM and hsv1&2 IgM negative; vzv igg positive 01/2020 urine gc/chlam pcr negative 01/2020 rpr negative  hiv labs        cd4 (%)/VL 9/23              50 (3%)/197k 11/16                        /373   Imaging: 11/16 chest xray left lung scarring      Assessment & Plan:   #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 Finished intensive tx 10/29. Last sputum cx 10/18. Negative on 9/5, 6, 19, 20 Pending sputum on 10/3, 4, 17, 18 Off ethambutol 9/16 as TB was pan-sensitive  -will need to call state health for sputum cx result; cxr 03/2020 residual scarring lll; no cavity -no side effect of tb meds -cbc/cmp to be done today; slight thrombocytopenia last visit along with azotemia due to hyperglycemia  #HHS -patient still refusing to  see his pcp; advise need to for chronic med management -no sign of dehydration today; labs to be done -glipizide xl rx'ed; continue metformin  #shingle Improved -finish valtrex  #right eye conj/pain x3 days Unclear what this is. On well controlled tb treatment with improvement. 3 months out from starting ART. Previous evaluation no infection. However will need to evaluate for OI or TB complication. Don't think it is medication related. He denies visual impairment. No trauma -advise patient to see his ophthalmologist by next week (will call and check on him) -if worsened, call us sooner, might have to place on nsaids/topical steroid drop -urine gc/chlam testing too although no sx of urethritis  #AIDS/HIV disease: started on dovato 02/29/2020. No prior therapy (new dx and referred to our clinic). No evidence Hepatitis B. Genotype 02/08/2020 no RTI or PI resistance (mutations -- RT a98S, v118I, R211k; PR I13v, d60e, l63p, v77i) -continue Dovato -discuss U=U -encourage continued compliance -continue bactrim -labs today -f/u 4 weeks  #HCM -will need discuss hepatitis B vaccination among others -patient to f/u pcp for lipid/dm2 monitoring --  encourage patient to go, discussed that RW will also provide rx coverage for his thyroid, dm2, htn/hlp problems -no cancer screening needed at this time  #social -patient looking for job -lives with friend.   We spent an hour coordinating care for this patient today

## 2020-05-02 NOTE — Patient Instructions (Addendum)
1) hiv - continue dovato, trimethoprim-sulfamethoxazole Will check blood test today - hiv is getting better  2) tuberculosis Will need to talk to your county health nurse and see what the sputum  If the sputum culture is negative, will plan to finish treatment for another 7 months to be done 09/2020  3) diabetes and other chronic medical problems Make sure you see your primary care doctor. I don't have sufficient time to take care of these well besides treating your lung infection (tb) and hiv.  I will prescribe the rest of your diabetes, thyroid, and cholesterol medication today along with a meter to check your blood sugar  4) finish the valacyclovir for your shingle  5) stop nystatin liquid. You no longer have thrush in your mouth  6) please call your eye clinic and be seen by next week for your red eye -- this is very important   Make sure you bring all medication and blood sugar meter/supplies next time Follow up 4 weeks

## 2020-05-03 LAB — T-HELPER CELLS (CD4) COUNT (NOT AT ARMC)
CD4 % Helper T Cell: 6 % — ABNORMAL LOW (ref 33–65)
CD4 T Cell Abs: 166 /uL — ABNORMAL LOW (ref 400–1790)

## 2020-05-06 LAB — COMPREHENSIVE METABOLIC PANEL
AG Ratio: 0.5 (calc) — ABNORMAL LOW (ref 1.0–2.5)
ALT: 24 U/L (ref 9–46)
AST: 34 U/L (ref 10–35)
Albumin: 3.1 g/dL — ABNORMAL LOW (ref 3.6–5.1)
Alkaline phosphatase (APISO): 129 U/L (ref 35–144)
BUN/Creatinine Ratio: 20 (calc) (ref 6–22)
BUN: 26 mg/dL — ABNORMAL HIGH (ref 7–25)
CO2: 26 mmol/L (ref 20–32)
Calcium: 9 mg/dL (ref 8.6–10.3)
Chloride: 102 mmol/L (ref 98–110)
Creat: 1.31 mg/dL (ref 0.70–1.33)
Globulin: 5.9 g/dL (calc) — ABNORMAL HIGH (ref 1.9–3.7)
Glucose, Bld: 180 mg/dL — ABNORMAL HIGH (ref 65–99)
Potassium: 3.9 mmol/L (ref 3.5–5.3)
Sodium: 133 mmol/L — ABNORMAL LOW (ref 135–146)
Total Bilirubin: 0.3 mg/dL (ref 0.2–1.2)
Total Protein: 9 g/dL — ABNORMAL HIGH (ref 6.1–8.1)

## 2020-05-06 LAB — CBC
HCT: 32.1 % — ABNORMAL LOW (ref 38.5–50.0)
Hemoglobin: 10.9 g/dL — ABNORMAL LOW (ref 13.2–17.1)
MCH: 31.9 pg (ref 27.0–33.0)
MCHC: 34 g/dL (ref 32.0–36.0)
MCV: 93.9 fL (ref 80.0–100.0)
MPV: 13 fL — ABNORMAL HIGH (ref 7.5–12.5)
Platelets: 51 10*3/uL — ABNORMAL LOW (ref 140–400)
RBC: 3.42 10*6/uL — ABNORMAL LOW (ref 4.20–5.80)
RDW: 15.5 % — ABNORMAL HIGH (ref 11.0–15.0)
WBC: 5.4 10*3/uL (ref 3.8–10.8)

## 2020-05-06 LAB — T4, FREE: Free T4: 0.8 ng/dL (ref 0.8–1.8)

## 2020-05-06 LAB — HEMOGLOBIN A1C
Hgb A1c MFr Bld: 8.2 % of total Hgb — ABNORMAL HIGH (ref <5.7)
Mean Plasma Glucose: 189 mg/dL
eAG (mmol/L): 10.4 mmol/L

## 2020-05-06 LAB — HIV-1 RNA QUANT-NO REFLEX-BLD
HIV 1 RNA Quant: 66 Copies/mL — ABNORMAL HIGH
HIV-1 RNA Quant, Log: 1.82 Log cps/mL — ABNORMAL HIGH

## 2020-05-06 LAB — TSH+FREE T4: TSH W/REFLEX TO FT4: 8.99 mIU/L — ABNORMAL HIGH (ref 0.40–4.50)

## 2020-05-07 ENCOUNTER — Telehealth: Payer: Self-pay

## 2020-05-07 NOTE — Telephone Encounter (Signed)
Hadassah Pais, NP at [facility] called with concerns about patients decreasing platelets. Patient is currently on TB treatment and platelets have been trending down for the last month. Most recent lab draw showed platelets of 51. Forwarding to Dr. Renold Don for further assistance.   Chanise Habeck Loyola Mast, RN

## 2020-05-08 NOTE — Telephone Encounter (Signed)
Manuel Pais, NP called to reconnect with Dr Lowella Dandy her Dr Orlando Penner pager. Andree Coss, RN

## 2020-05-23 ENCOUNTER — Other Ambulatory Visit: Payer: Self-pay | Admitting: Internal Medicine

## 2020-05-23 ENCOUNTER — Other Ambulatory Visit: Payer: Self-pay

## 2020-05-23 ENCOUNTER — Encounter: Payer: Self-pay | Admitting: Internal Medicine

## 2020-05-23 ENCOUNTER — Ambulatory Visit: Payer: Self-pay | Admitting: Internal Medicine

## 2020-05-23 VITALS — BP 127/72 | HR 95 | Temp 98.6°F | Ht 61.0 in | Wt 135.0 lb

## 2020-05-23 DIAGNOSIS — E1165 Type 2 diabetes mellitus with hyperglycemia: Secondary | ICD-10-CM

## 2020-05-23 DIAGNOSIS — Z7984 Long term (current) use of oral hypoglycemic drugs: Secondary | ICD-10-CM

## 2020-05-23 MED ORDER — INVOKAMET XR 50-1000 MG PO TB24: 2.0000 | ORAL_TABLET | Freq: Every morning | ORAL | 3 refills | Status: DC

## 2020-05-23 NOTE — Progress Notes (Signed)
   CC: type II diabetes  HPI:  Manuel Hayes is a 50 y.o. with PMH as below.   Please see A&P for assessment of the patient's acute and chronic medical conditions.   Family History: Mother - TIIDM Siblings - TIIDM  Social History: Does not smoke at all, no alcohol use. He is not currently working due to being treated for tuberculosis   Past Medical History:  Diagnosis Date  . HIV disease (HCC) 02/08/2020  . Hyperlipidemia   . Pulmonary nodule   . 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   Gastrointestinal: Negative for abdominal pain, nausea and vomiting.  Genitourinary: Negative for dysuria, frequency and urgency.  Neurological: Negative for dizziness, weakness and headaches.  Endo/Heme/Allergies: Negative for polydipsia.   Physical Exam: Constitution: NAD, appears stated age HENT: [place]/AT Eyes: no icterus or injection  Cardio: RRR, no m/r/g, no LE edema  Respiratory: CTA, no w/r/r Abdominal: NTTP, soft, non-distended MSK: moving all extremities Neuro: normal affect, pleasant   Vitals:   05/23/20 1335  BP: 127/72  Pulse: 95  Temp: 98.6 F (37 C)  TempSrc: Oral  SpO2: 100%  Weight: 135 lb (61.2 kg)  Height: 5\' 1"  (1.549 m)    Assessment & Plan:   See Encounters Tab for problem based charting.  Patient discussed with Dr. 

## 2020-05-23 NOTE — Patient Instructions (Signed)
Gracias por la cita hoy.   Por favor empieza tomar:  Invokamet - toma 2 pastilla cada dia antes desayuno.    Para tomar:   Metformin y glipizide    Necesita una cita con [place] para la tarjeta de naranja.   Por favor, regressa en tres meses a ver la hemoglobin a1c

## 2020-05-24 NOTE — Assessment & Plan Note (Signed)
Diagnosed around six years ago, he has been on metformin 1000 mg bid and glipizide 2.5 mg as long as he remembers. He does not have a glucometer and does not check his glucose. No symptoms of hypoglycemia, no polydipsia or increased urination.  A1c check recently was 8.2, and he was referred here by infectious disease as he no longer has a PCP.   - start invokamet 50-1000 mg xr two tablets per day - stop metformin and glipizide  - f/u in three months for a1c check.  - will see if it would be possible to get him a glucometer, message sent to Lupita Leash - discussed symptoms of hypoglycemia and to call clinic if this occurs  - foot exam today  - referral placed to ophthalmology  - obtain urine microalbumin once patient has orange card

## 2020-05-27 NOTE — Progress Notes (Signed)
Internal Medicine Clinic Attending  Case discussed with Dr. Cleaster Corin  At the time of the visit.  We reviewed the residents history and exam and pertinent patient test results.  I agree with the assessment, diagnosis, and plan of care documented in the residents note.

## 2020-05-31 ENCOUNTER — Other Ambulatory Visit: Payer: Self-pay

## 2020-05-31 ENCOUNTER — Ambulatory Visit (INDEPENDENT_AMBULATORY_CARE_PROVIDER_SITE_OTHER): Payer: Self-pay | Admitting: Internal Medicine

## 2020-05-31 ENCOUNTER — Telehealth: Payer: Self-pay

## 2020-05-31 ENCOUNTER — Encounter: Payer: Self-pay | Admitting: Internal Medicine

## 2020-05-31 VITALS — BP 147/84 | HR 103 | Wt 138.0 lb

## 2020-05-31 DIAGNOSIS — B20 Human immunodeficiency virus [HIV] disease: Secondary | ICD-10-CM

## 2020-05-31 DIAGNOSIS — A150 Tuberculosis of lung: Secondary | ICD-10-CM

## 2020-05-31 MED ORDER — EMTRICITABINE-TENOFOVIR AF 200-25 MG PO TABS: 1.0000 | ORAL_TABLET | Freq: Every day | ORAL | 3 refills | Status: DC

## 2020-05-31 MED ORDER — DOLUTEGRAVIR SODIUM 50 MG PO TABS: 50.0000 mg | ORAL_TABLET | Freq: Two times a day (BID) | ORAL | 3 refills | Status: AC

## 2020-05-31 MED ORDER — DOLUTEGRAVIR SODIUM 50 MG PO TABS: 50.0000 mg | ORAL_TABLET | Freq: Two times a day (BID) | ORAL | 0 refills | Status: DC

## 2020-05-31 MED ORDER — EMTRICITABINE-TENOFOVIR AF 200-25 MG PO TABS: 1.0000 | ORAL_TABLET | Freq: Every day | ORAL | 0 refills | Status: DC

## 2020-05-31 MED ORDER — DOLUTEGRAVIR SODIUM 50 MG PO TABS: 50.0000 mg | ORAL_TABLET | Freq: Two times a day (BID) | ORAL | 3 refills | Status: DC

## 2020-05-31 NOTE — Telephone Encounter (Signed)
Per Dr. Renold Don, called Tammy with [facility] - TB dept for updates on treatment plan and to see about any lab changes. Left vm requesting call back. Forwarding to triage for follow up.   Roschelle Calandra Loyola Mast, RN

## 2020-05-31 NOTE — Telephone Encounter (Signed)
Tammy returned call states labs drawn where recently Monday and patient to continue same TB regimen. Tammy will fax updated labs to Attention Dr. Renold Don to triage fax at [phone]

## 2020-05-31 NOTE — Telephone Encounter (Signed)
Tammy Faucette with [facility] called back and states Tammy Moss the patient's nurse will call back with an update

## 2020-05-31 NOTE — Progress Notes (Signed)
Chief complaint: f/u pulm TB and hiv care  Subjective:    Patient ID: Manuel Hayes, male    DOB: 1970-09-16, 50 y.o.   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 He was changed from rifampin to rifabutin for preparation of starting ART.  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 He has labs monitored along with recent sputum  He still has some dry mouth. He denies headache, visual blurriness, eye pain, visual loss  He is eating well and gaining weight  No n/v/rash, joint pain, diarrhea.   dovato started 10/14  meds reviewed: Dovato His home dm2 meds TB meds   04/02/20 id f/u Doing well, gaining weight (110 --> 130 pounds) Feeling well, no complaint Cough much improved Not seeing his PCP at [facility] clinic as afraid he can't afford medications. Off and on lovastatin, metformin, glipizide, and lisinopril and levothyroxine Taking dovato; not taking bactrim Still getting DOT for tb meds. Finished with intensive therapy 2 months on 10/30. He is on b6, rifabutin, and inh. No side effect   12/16 id f/u HIV no missed dose pulm tb -- on DOT. No concern. He doesn't know when his last sputum testing was Patient still haven't seen his pcp at [facility] clinic (financial) He brings his pill box today dovato Bactrim Nystatin liquid Lisinopril Metformin Valacyclovir (he is supposed to be on synthroid, lovastatin, and glipizide as well); last sugar check was very  high He in the mean time developed: 1) right eye pain/redness 3 days; no f/c or decreased vision. He has an eye doctor next visit 05/2020. He is on tb treatment on continuation phase now. His last cxr 03/2020 showed scarring left lower lobe. No f/c/nightsweat/cough/weight loss. Previously has some blurry vision but initial ophthalmologic visit no concern for infection; serologic w/u syphilis/toxo/cmv negative. No penile rash/discharge 2) recently seen by urgent care for right thigh shingles given valacyclovir; has 1 more pill to take. Lesions had scabbed over; pain much better  Labs to be done today   05/31/2020 id f/u He was referred and finally saw internal medicine clinic 1/07 [facility] care He returns today for tb/hiv care Shingle had resolved Never saw ophthalmology (awaiting financial aid), however eye redness/discomfort resolved No f/c/n/v/diarrhea No cough Reviewed labs. Platelet has been at just above 50 Getting DOT for tb tx Compliant no missing dose of dovato  Past Medical History:  Diagnosis Date  . HIV disease ([place]) 02/08/2020  . Hyperlipidemia   . Pulmonary nodule   . 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  Family History  Problem Relation Age of Onset  . Diabetes Mother   . Diabetes Brother       Social History   Socioeconomic History  . Marital status: Single    Spouse name: Not on file  . Number of children: Not on file  . Years of education: Not on file  . Highest education level: Not on file  Occupational  History  . Not on file  Tobacco Use  . Smoking status: Never Smoker  . Smokeless tobacco: Never Used  Substance and Sexual Activity  . Alcohol use: Not Currently  . Drug use: Never  . Sexual activity: Not Currently    Comment: pt given condoms 05/02/20  Other Topics Concern  . Not on file  Social History Narrative  . Not on file   Social Determinants of Health   Financial Resource Strain: Not  on file  Food Insecurity: Not on file  Transportation Needs: Not on file  Physical Activity: Not on file  Stress: Not on file  Social Connections: Not on file    No Known Allergies   Current Outpatient Medications:  .  Dolutegravir-lamiVUDine (DOVATO) 50-300 MG TABS, Take 1 tablet by mouth daily., Disp: 30 tablet, Rfl: 5 .  Accu-Chek Softclix Lancets lancets, Use as instructed, Disp: 100 each, Rfl: 12 .  Alcohol Swabs PADS, Clean fingers/equipments prior to CBG testing of your fingers, Disp: 100 each, Rfl: 11 .  blood glucose meter kit and supplies KIT, Dispense based on patient and insurance preference. Use up to four times daily as directed. (FOR ICD-9 250.00, 250.01)., Disp: 1 each, Rfl: 0 .  Canagliflozin-metFORMIN HCl ER (INVOKAMET XR) 50-1000 MG TB24, Take 2 tablets by mouth in the morning., Disp: 30 tablet, Rfl: 3 .  gabapentin (NEURONTIN) 100 MG capsule, Take 1 capsule (100 mg total) by mouth 3 (three) times daily. (Patient not taking: Reported on 05/02/2020), Disp: 90 capsule, Rfl: 2 .  glipiZIDE (GLUCOTROL XL) 2.5 MG 24 hr tablet, Take 1 tablet (2.5 mg total) by mouth daily with breakfast., Disp: 30 tablet, Rfl: 11 .  hydrocortisone (ANUSOL-HC) 2.5 % rectal cream, Proctosol HC 2.5 % topical cream perineal applicator  APPLY TO RECTUM 2 3 TIMES DAILY (Patient not taking: Reported on 05/02/2020), Disp: , Rfl:  .  levothyroxine (SYNTHROID) 25 MCG tablet, Take 1 tablet (25 mcg total) by mouth daily before breakfast., Disp: 30 tablet, Rfl: 11 .  lisinopril (ZESTRIL) 20 MG tablet, Take 20 mg by mouth at bedtime., Disp: , Rfl:  .  lovastatin (MEVACOR) 20 MG tablet, Take 1 tablet (20 mg total) by mouth at bedtime., Disp: 30 tablet, Rfl: 11 .  sulfamethoxazole-trimethoprim (BACTRIM DS) 800-160 MG tablet, Take 1 tablet by mouth 2 (two) times daily., Disp: , Rfl:  .  valACYclovir (VALTREX) 1000 MG tablet, Take 1 tablet (1,000 mg total) by mouth 3 (three) times daily., Disp: 30 tablet, Rfl:  0  ROS: Negative 11 point ros unless mentioned above     Objective:    Vitals reviewed No distress, well appearing, conversant (spanish speaking), cooperative Heent: conj clear; eomi; perrl Neck supple Lymph: no axillary, cervical/inguinal adenopathy CV rrr no mrg Lungs cta abd s/nt Ext no edema Skin no rash msk no synovitis Neuro nonfocal Psych alert/oriented   Labs: 12/16 cr 1.3; lft 34/24/129/0.3; cbc 5.08/26/49; glucose 180s 11/16 lft 26/39; cr 1.5; sodium 127; cbc 08/25/70; glucose 597  01/2020 toxoplasma IgG positive; hep b sAg/sAb/cAb NR; hep C ab negative; Hep A ab reactive; cmv IgM and hsv1&2 IgM negative; vzv igg positive 01/2020 urine gc/chlam pcr negative 01/2020 rpr negative  hiv labs        cd4 (%)  /  VL 9/23              50 (3%) / 197k 11/16                          /  373 12/16           166 (6)    /  66   Imaging: 11/16 chest xray left lung scarring      Assessment & Plan:   #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 Finished intensive tx 10/29. Last sputum cx 10/18. Negative on 9/5, 6, 19, 20 Pending sputum on 10/3, 4, 17, 18 Off ethambutol 9/16 as TB was pan-sensitive 03/2020 cxr residual scarring LLL, no cavity  -await state health update on his tb meds -platelet dropping near 50s, but stable. Would like to keep going with rifampin based regimen; if rifampin is stopped, he'll likely end up with a 2 years alternative regimen which is not ideal. Otherwise if he could tolerate, anticipate total 6 months of treatment which should finish sometimes 07/2020 -repeat labs today -I will change his art to tivicay 50 bid and descovy 05/31/2020; stop dovato for now while on rifampin  #HHS/dm2 -finally saw internal medicine last week; diabetic meds adjusted -continue dm2 meds and f/u im clinic  #shingle of right thigh resolved after the valtrex course  #right eye conj/pain x3 days during last visit 12/16. Advised him to go to ophthalmology; he  didn't go yet, but sx had resolved -he'll have a f/u appointment with ophthalmology soon for routine care with dm2  #AIDS/HIV disease: started on dovato 02/29/2020. No prior therapy (new dx and referred to our clinic). No evidence Hepatitis B. Genotype 02/08/2020 no RTI or PI resistance (mutations -- RT a98S, v118I, R211k; PR I13v, d60e, l63p, v77i). He is doing very well on Dovato, and very compliant  -see above regarding dovato --> tivicay bid and descovy and ddi with rifampin -discuss U=U -encourage continued compliance -continue bactrim -labs today -f/u 3 months with preclinic labs  [facility] -will need discuss hepatitis B vaccination among others when his cd4 count >200 -no cancer screening needed at this time  #social -patient looking for job -lives with friend.  #dm2/htn/hlp/thyroid issue -f/u pcp for these issues

## 2020-05-31 NOTE — Patient Instructions (Addendum)
You are doing very well with your HIV and tuberculosis  Your viral load is almost undetectable. I suspect it will be undetectable in another 2 weeks  Keep taking your HIV medication, as this will keep the virus in check, and improve your immune system   Follow up with [place] in 3 months. Please do blood test 1 week before you see me  Today will check your hiv viral load as well  I will wait and talk to the state health about your tuberculosis. I anticipate if we can continue this regimen of TB medication, we can finish treatment early 07/2020   For your diabetes/blood pressure/cholesterol, please continue to follow your primary care doctor   -------------- *for your hiv, lets make this small change Stop dovato Start taking tivicay twice a day Start taking descovy once a day This is only for while you are treated for TB  You'll pickup 1 month supply of the tivicay/descovy today at [facility] Then the [facility] [place] will keep sending more meds to you

## 2020-06-05 ENCOUNTER — Other Ambulatory Visit: Payer: Self-pay | Admitting: Internal Medicine

## 2020-06-05 ENCOUNTER — Telehealth: Payer: Self-pay | Admitting: Dietician

## 2020-06-05 DIAGNOSIS — E1165 Type 2 diabetes mellitus with hyperglycemia: Secondary | ICD-10-CM

## 2020-06-05 MED ORDER — CONTOUR NEXT TEST VI STRP: ORAL_STRIP | 3 refills | Status: DC

## 2020-06-05 MED ORDER — MICROLET LANCETS MISC: 3 refills | Status: DC

## 2020-06-05 NOTE — Telephone Encounter (Signed)
Using [facility] [phone], called patient per Dr. Cleaster Corin to arrange for him to pick up a sample Contour Next EZ blood glucose meter and pick up strips at [facility]. Patient educated about using meter and if needed watching video instructions on the Internet. He verbalized understanding.

## 2020-06-05 NOTE — Addendum Note (Signed)
Addended by: Baird Cancer on: 06/05/2020 01:56 PM   Modules accepted: Orders

## 2020-06-06 LAB — HIV-1 RNA QUANT-NO REFLEX-BLD
HIV 1 RNA Quant: 25 Copies/mL — ABNORMAL HIGH
HIV-1 RNA Quant, Log: 1.4 Log cps/mL — ABNORMAL HIGH

## 2020-06-07 ENCOUNTER — Other Ambulatory Visit: Payer: Self-pay | Admitting: Internal Medicine

## 2020-06-07 NOTE — Progress Notes (Signed)
osh labs from [facility] [place] [facility]   05/27/20 cbc 09/25/57; cr not visualized (poor fax result) ; lft wnl  04/05/20 sputum afb cx negative 04/03/20 sputum afb cx negative

## 2020-06-11 ENCOUNTER — Ambulatory Visit: Payer: No Typology Code available for payment source

## 2020-06-18 ENCOUNTER — Ambulatory Visit: Payer: No Typology Code available for payment source | Admitting: Internal Medicine

## 2020-07-10 ENCOUNTER — Encounter: Payer: Self-pay | Admitting: Internal Medicine

## 2020-07-19 ENCOUNTER — Other Ambulatory Visit: Payer: Self-pay | Admitting: Internal Medicine

## 2020-07-19 DIAGNOSIS — E1165 Type 2 diabetes mellitus with hyperglycemia: Secondary | ICD-10-CM

## 2020-07-19 MED ORDER — INVOKAMET XR 50-1000 MG PO TB24: 2.0000 | ORAL_TABLET | Freq: Every morning | ORAL | 0 refills | Status: DC

## 2020-07-24 ENCOUNTER — Other Ambulatory Visit: Payer: Self-pay

## 2020-07-24 ENCOUNTER — Ambulatory Visit (INDEPENDENT_AMBULATORY_CARE_PROVIDER_SITE_OTHER): Payer: Self-pay | Admitting: Internal Medicine

## 2020-07-24 ENCOUNTER — Encounter: Payer: Self-pay | Admitting: Internal Medicine

## 2020-07-24 VITALS — BP 136/78 | HR 81 | Temp 98.2°F | Wt 137.0 lb

## 2020-07-24 DIAGNOSIS — A150 Tuberculosis of lung: Secondary | ICD-10-CM

## 2020-07-24 DIAGNOSIS — E1165 Type 2 diabetes mellitus with hyperglycemia: Secondary | ICD-10-CM

## 2020-07-24 DIAGNOSIS — M130 Polyarthritis, unspecified: Secondary | ICD-10-CM

## 2020-07-24 DIAGNOSIS — E785 Hyperlipidemia, unspecified: Secondary | ICD-10-CM

## 2020-07-24 DIAGNOSIS — Z113 Encounter for screening for infections with a predominantly sexual mode of transmission: Secondary | ICD-10-CM

## 2020-07-24 DIAGNOSIS — B20 Human immunodeficiency virus [HIV] disease: Secondary | ICD-10-CM

## 2020-07-24 DIAGNOSIS — M2550 Pain in unspecified joint: Secondary | ICD-10-CM

## 2020-07-24 MED ORDER — SULFAMETHOXAZOLE-TRIMETHOPRIM 800-160 MG PO TABS: 1.0000 | ORAL_TABLET | Freq: Every day | ORAL | 1 refills | Status: DC

## 2020-07-24 NOTE — Progress Notes (Signed)
Chief complaint: f/u pulm TB and hiv care  Subjective:    Patient ID: Manuel Hayes, male    DOB: July 12, 1970, 50 y.o.   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 He was changed from rifampin to rifabutin for preparation of starting ART.  -he doesn't know how he got hiv. No hx ivdu. Heterosexual. Prior married in [place]. 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 He has labs monitored along with recent sputum  He still has some dry mouth. He denies headache, visual blurriness, eye pain, visual loss  He is eating well and gaining weight  No n/v/rash, joint pain, diarrhea.   dovato started 10/14  meds reviewed: Dovato His home dm2 meds TB meds   04/02/20 id f/u Doing well, gaining weight (110 --> 130 pounds) Feeling well, no complaint Cough much improved Not seeing his PCP at [facility] clinic as afraid he can't afford medications. Off and on lovastatin, metformin, glipizide, and lisinopril and levothyroxine Taking dovato; not taking bactrim Still getting DOT for tb meds. Finished with intensive therapy 2 months on 10/30. He is on b6, rifabutin, and inh. No side effect   12/16 id f/u HIV no missed dose pulm tb -- on DOT. No concern. He doesn't know when his last sputum testing was Patient still haven't seen his pcp at [facility] clinic (financial) He brings his pill box today dovato Bactrim Nystatin  liquid Lisinopril Metformin Valacyclovir (he is supposed to be on synthroid, lovastatin, and glipizide as well); last sugar check was very high He in the mean time developed: 1) right eye pain/redness 3 days; no f/c or decreased vision. He has an eye doctor next visit 05/2020. He is on tb treatment on continuation phase now. His last cxr 03/2020 showed scarring left lower lobe. No f/c/nightsweat/cough/weight loss. Previously has some blurry vision but initial ophthalmologic visit no concern for infection; serologic w/u syphilis/toxo/cmv negative. No penile rash/discharge 2) recently seen by urgent care for right thigh shingles given valacyclovir; has 1 more pill to take. Lesions had scabbed over; pain much better  Labs to be done today   05/31/2020 id f/u He was referred and finally saw internal medicine clinic 1/07 at [facility] for primary care He returns today for tb/hiv care [place] had resolved Never saw ophthalmology (awaiting financial aid), however eye redness/discomfort resolved No f/c/n/v/diarrhea No cough Reviewed labs. Platelet has been at just above 50 Getting DOT for tb tx Compliant no missing dose of dovato  07/24/20 id f/u Complain of bilateral hand pain in the ip joints for about 1-2 month and also ankle/toes. These are most pronounced in the heels and the fingers. No wrist, knees, elbows or back pain. Some shoulder pain No skin rash No fever, chill Pain stable, 8/10. Constant daily.  No swelling Sx improves as the day goes along. 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  Living with a friend No sexual relationship or encounter Wife in [place], haven't met in more than 5 years; he sends money to  her   Past Medical History:  Diagnosis Date  . HIV disease ([place]) 02/08/2020  . Hyperlipidemia   . Pulmonary nodule   . 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  Family History  Problem Relation Age of Onset  . Diabetes Mother   . Diabetes Brother       Social History   Socioeconomic History  . Marital status: Single    Spouse name: Not on file  . Number of children: Not on file  . Years of education: Not on file  . Highest education level: Not on file  Occupational History  . Not on file  Tobacco Use  . Smoking status: Never Smoker  . Smokeless tobacco: Never Used  Substance and Sexual Activity  . Alcohol use: Not Currently  . Drug use: Never  . Sexual activity: Not Currently    Comment: pt given condoms 07/24/20  Other Topics Concern  . Not on file  Social History Narrative  . Not on file   Social Determinants of Health   Financial Resource Strain: Not on file  Food Insecurity: Not on file  Transportation Needs: Not on file  Physical Activity: Not on file  Stress: Not on file  Social Connections: Not on file    No Known Allergies   Current Outpatient Medications:  .  Alcohol Swabs PADS, Clean fingers/equipments prior to CBG testing of your fingers, Disp: 100 each, Rfl: 11 .  blood glucose meter kit and supplies KIT, Dispense based on patient and insurance preference. Use up to four times daily as directed. (FOR ICD-9 250.00, 250.01)., Disp: 1 each, Rfl: 0 .  Canagliflozin-metFORMIN HCl ER (INVOKAMET XR) 50-1000 MG TB24, Take 2 tablets by mouth in the morning., Disp: 60 tablet, Rfl: 0 .  dolutegravir (TIVICAY) 50 MG tablet, Take 1 tablet (50 mg total) by mouth 2 (two) times daily., Disp: 60 tablet, Rfl: 0 .  emtricitabine-tenofovir AF (DESCOVY) 200-25 MG tablet, Take 1 tablet by mouth daily., Disp: 30 tablet, Rfl: 3 .  glucose blood (CONTOUR NEXT TEST) test strip, selo para controlar el nivel de azcar en la sangre segn las  instrucciones.(up to 7 times a week), Disp: 100 each, Rfl: 3 .  lovastatin (MEVACOR) 20 MG tablet, Take 1 tablet (20 mg total) by mouth at bedtime., Disp: 30 tablet, Rfl: 11 .  Microlet Lancets MISC, selo para controlar el nivel de azcar en la sangre segn las instrucciones.(up to 7 times a week), Disp: 100 each, Rfl: 3 .  emtricitabine-tenofovir AF (DESCOVY) 200-25 MG tablet, Take 1 tablet by mouth daily., Disp: 30 tablet, Rfl: 0 .  gabapentin (NEURONTIN) 100 MG capsule, Take 1 capsule (100 mg total) by mouth 3 (three) times daily. (Patient not taking: No sig reported), Disp: 90 capsule, Rfl: 2 .  glipiZIDE (GLUCOTROL XL) 2.5 MG 24 hr tablet, Take 1 tablet (2.5 mg total) by mouth daily with breakfast. (Patient not taking: Reported on 07/24/2020), Disp: 30 tablet, Rfl: 11 .  hydrocortisone (ANUSOL-HC) 2.5 % rectal cream, Proctosol HC 2.5 % topical cream perineal applicator  APPLY TO RECTUM 2 3 TIMES DAILY (Patient not taking: No sig reported), Disp: , Rfl:  .  levothyroxine (SYNTHROID) 25 MCG tablet, Take 1 tablet (  25 mcg total) by mouth daily before breakfast. (Patient not taking: Reported on 07/24/2020), Disp: 30 tablet, Rfl: 11 .  lisinopril (ZESTRIL) 20 MG tablet, Take 20 mg by mouth at bedtime. (Patient not taking: Reported on 07/24/2020), Disp: , Rfl:  .  sulfamethoxazole-trimethoprim (BACTRIM DS) 800-160 MG tablet, Take 1 tablet by mouth 2 (two) times daily. (Patient not taking: Reported on 07/24/2020), Disp: , Rfl:  .  valACYclovir (VALTREX) 1000 MG tablet, Take 1 tablet (1,000 mg total) by mouth 3 (three) times daily. (Patient not taking: Reported on 07/24/2020), Disp: 30 tablet, Rfl: 0  ROS: Negative 11 point ros unless mentioned above     Objective:    Vitals reviewed No distress, well appearing, conversant (spanish speaking), cooperative Heent: conj clear; eomi; perrl Neck supple Lymph: no axillary, cervical/inguinal adenopathy CV rrr no mrg Lungs cta abd s/nt Ext no edema Skin no  rash msk no synovitis, tender on palpation palms/fingers and tender on flexion/extension of wrist/fingers at the palm/fingers; no swelling/warmth Neuro nonfocal Psych alert/oriented   Labs: 12/16 cr 1.3; lft 34/24/129/0.3; cbc 5.08/26/49; glucose 180s 11/16 lft 26/39; cr 1.5; sodium 127; cbc 08/25/70; glucose 597  01/2020 toxoplasma IgG positive; hep b sAg/sAb/cAb NR; hep C ab negative; Hep A ab reactive; cmv IgM and hsv1&2 IgM negative; vzv igg positive 01/2020 urine gc/chlam pcr negative 01/2020 rpr negative  hiv labs        cd4 (%)  /  VL 9/23              50 (3%) / 197k 11/16                          / 373 12/16           166 (6)    /  66   Imaging: 11/16 chest xray left lung scarring      Assessment & Plan:   #Pulmonary TB: #thrombocytopenia, meds induced Started ATB tx first week of 01/2020. Finished intensive tx 10/29.  AFB sputum surveillance ngative on 9/5, 6, 19, 20, 10/3, 4, 17, 18. Spoke with RN Ishmael Holter from [facility] Off ethambutol 9/16 as TB was pan-sensitive 03/2020 cxr residual scarring LLL, no cavity Developed thrombocytopenia into 50s, thought to be related to rifampin, but improved  -platelet dropping near 50s, but subsequently improved -he should be finished with tb tx at end of 6 months which is late 07/2020 vs early 08/2020 -continue tivicay 50 bid (increased to bid on 05/31/20 visit), and descovy 05/31/2020  #AIDS/HIV disease: started on dovato 02/29/2020. No prior therapy (new dx and referred to our clinic). No evidence Hepatitis B. Genotype 02/08/2020 no RTI or PI resistance (mutations -- RT a98S, v118I, R211k; PR I13v, d60e, l63p, v77i). He is doing very well on Dovato, and very compliant  -see above regarding dovato --> tivicay bid and descovy and ddi with rifampin -discuss U=U -encourage continued compliance -advise him to continue taking bactrim -- rx given -labs today -f/u 1 months (for joint pain no labs  #polyarthritis -tb meds related vs inflammatory  polyarthritis (reactive vs primary from rheumatoid or ctd); will also check cpk to see if pm/dm; vit d/cpk to make sure not myalgia related or osteomalacia; esr/crp -will consider xray hands/wrists on next visit if labs and story continue to be suggestive -will also check tsh -f/u 1 month   #dm2 #hlp #htn #hypothyroidism Followed by the internal medicine clinic; previously seen in [place] clinic but stopped seeing due  to finance -had hhs but dm2 meds adjusted via internal medicine clinic -continue dm2 meds and f/u im clinic    [facility] -will need discuss hepatitis B vaccination among others when his cd4 count >200 -no cancer screening needed at this time

## 2020-07-24 NOTE — Patient Instructions (Addendum)
Continue tivicay and descovy for hiv Restart bactrim (new rx placed today --> please pick up)  Finish your tb medication with the state health nurse   Labs today; follow up in 1 month for this.  Take tylenol and ibuprofen as needed for pain

## 2020-07-25 LAB — T-HELPER CELLS (CD4) COUNT (NOT AT ARMC)
CD4 % Helper T Cell: 8 % — ABNORMAL LOW (ref 33–65)
CD4 T Cell Abs: 129 /uL — ABNORMAL LOW (ref 400–1790)

## 2020-07-25 LAB — URINE CYTOLOGY ANCILLARY ONLY
Chlamydia: NEGATIVE
Comment: NEGATIVE
Comment: NEGATIVE
Comment: NORMAL
Neisseria Gonorrhea: NEGATIVE
Trichomonas: NEGATIVE

## 2020-07-26 LAB — COMPLETE METABOLIC PANEL WITH GFR
AG Ratio: 0.6 (calc) — ABNORMAL LOW (ref 1.0–2.5)
ALT: 16 U/L (ref 9–46)
AST: 26 U/L (ref 10–35)
Albumin: 3.4 g/dL — ABNORMAL LOW (ref 3.6–5.1)
Alkaline phosphatase (APISO): 116 U/L (ref 35–144)
BUN: 20 mg/dL (ref 7–25)
CO2: 29 mmol/L (ref 20–32)
Calcium: 9.1 mg/dL (ref 8.6–10.3)
Chloride: 102 mmol/L (ref 98–110)
Creat: 1.29 mg/dL (ref 0.70–1.33)
GFR, Est African American: 73 mL/min/{1.73_m2} (ref >=60)
GFR, Est Non African American: 63 mL/min/{1.73_m2} (ref >=60)
Globulin: 5.3 g/dL (calc) — ABNORMAL HIGH (ref 1.9–3.7)
Glucose, Bld: 161 mg/dL — ABNORMAL HIGH (ref 65–99)
Potassium: 3.9 mmol/L (ref 3.5–5.3)
Sodium: 139 mmol/L (ref 135–146)
Total Bilirubin: 0.3 mg/dL (ref 0.2–1.2)
Total Protein: 8.7 g/dL — ABNORMAL HIGH (ref 6.1–8.1)

## 2020-07-26 LAB — C-REACTIVE PROTEIN: CRP: 5.3 mg/L (ref <8.0)

## 2020-07-26 LAB — CBC WITH DIFFERENTIAL/PLATELET
Absolute Monocytes: 416 cells/uL (ref 200–950)
Basophils Absolute: 20 cells/uL (ref 0–200)
Basophils Relative: 0.5 %
Eosinophils Absolute: 108 cells/uL (ref 15–500)
Eosinophils Relative: 2.7 %
HCT: 38.6 % (ref 38.5–50.0)
Hemoglobin: 13.1 g/dL — ABNORMAL LOW (ref 13.2–17.1)
Lymphs Abs: 1792 cells/uL (ref 850–3900)
MCH: 31.6 pg (ref 27.0–33.0)
MCHC: 33.9 g/dL (ref 32.0–36.0)
MCV: 93.2 fL (ref 80.0–100.0)
MPV: 12.6 fL — ABNORMAL HIGH (ref 7.5–12.5)
Monocytes Relative: 10.4 %
Neutro Abs: 1664 cells/uL (ref 1500–7800)
Neutrophils Relative %: 41.6 %
Platelets: 103 10*3/uL — ABNORMAL LOW (ref 140–400)
RBC: 4.14 10*6/uL — ABNORMAL LOW (ref 4.20–5.80)
RDW: 12 % (ref 11.0–15.0)
Total Lymphocyte: 44.8 %
WBC: 4 10*3/uL (ref 3.8–10.8)

## 2020-07-26 LAB — HIV-1 RNA QUANT-NO REFLEX-BLD
HIV 1 RNA Quant: <20 Copies/mL — ABNORMAL HIGH
HIV-1 RNA Quant, Log: <1.3 Log cps/mL — ABNORMAL HIGH

## 2020-07-26 LAB — HEMOGLOBIN A1C
Hgb A1c MFr Bld: 6.3 % of total Hgb — ABNORMAL HIGH (ref <5.7)
Mean Plasma Glucose: 134 mg/dL
eAG (mmol/L): 7.4 mmol/L

## 2020-07-26 LAB — CYCLIC CITRUL PEPTIDE ANTIBODY, IGG: Cyclic Citrullin Peptide Ab: <16 UNITS

## 2020-07-26 LAB — LIPID PANEL
Cholesterol: 180 mg/dL (ref <200)
HDL: 36 mg/dL — ABNORMAL LOW (ref >=40)
LDL Cholesterol (Calc): 103 mg/dL (calc) — ABNORMAL HIGH
Non-HDL Cholesterol (Calc): 144 mg/dL (calc) — ABNORMAL HIGH (ref <130)
Total CHOL/HDL Ratio: 5 (calc) — ABNORMAL HIGH (ref <5.0)
Triglycerides: 310 mg/dL — ABNORMAL HIGH (ref <150)

## 2020-07-26 LAB — RHEUMATOID FACTOR: Rheumatoid fact SerPl-aCnc: <14 IU/mL (ref <14)

## 2020-07-26 LAB — TSH: TSH: 4.43 mIU/L (ref 0.40–4.50)

## 2020-07-26 LAB — RPR: RPR Ser Ql: NONREACTIVE

## 2020-07-26 LAB — VITAMIN D 25 HYDROXY (VIT D DEFICIENCY, FRACTURES): Vit D, 25-Hydroxy: 32 ng/mL (ref 30–100)

## 2020-07-26 LAB — ANA: Anti Nuclear Antibody (ANA): NEGATIVE

## 2020-07-26 LAB — SEDIMENTATION RATE: Sed Rate: 113 mm/h — ABNORMAL HIGH (ref 0–20)

## 2020-08-21 ENCOUNTER — Other Ambulatory Visit: Payer: Self-pay | Admitting: Obstetrics and Gynecology

## 2020-08-21 ENCOUNTER — Other Ambulatory Visit: Payer: Self-pay

## 2020-08-21 ENCOUNTER — Ambulatory Visit
Admission: RE | Admit: 2020-08-21 | Discharge: 2020-08-21 | Disposition: A | Discharge status: Home / Self Care | Payer: No Typology Code available for payment source | Source: Ambulatory Visit | Attending: Obstetrics and Gynecology | Admitting: Obstetrics and Gynecology

## 2020-08-21 DIAGNOSIS — Z5189 Encounter for other specified aftercare: Secondary | ICD-10-CM

## 2020-08-23 ENCOUNTER — Other Ambulatory Visit: Payer: Self-pay | Admitting: Internal Medicine

## 2020-08-23 ENCOUNTER — Other Ambulatory Visit (HOSPITAL_COMMUNITY): Payer: Self-pay

## 2020-08-23 ENCOUNTER — Ambulatory Visit (INDEPENDENT_AMBULATORY_CARE_PROVIDER_SITE_OTHER): Payer: Self-pay | Admitting: Internal Medicine

## 2020-08-23 ENCOUNTER — Encounter: Payer: Self-pay | Admitting: Internal Medicine

## 2020-08-23 ENCOUNTER — Other Ambulatory Visit: Payer: Self-pay

## 2020-08-23 VITALS — BP 131/75 | HR 73 | Temp 98.3°F | Wt 133.0 lb

## 2020-08-23 DIAGNOSIS — M19042 Primary osteoarthritis, left hand: Secondary | ICD-10-CM

## 2020-08-23 DIAGNOSIS — M19041 Primary osteoarthritis, right hand: Secondary | ICD-10-CM

## 2020-08-23 DIAGNOSIS — M2550 Pain in unspecified joint: Secondary | ICD-10-CM

## 2020-08-23 DIAGNOSIS — B20 Human immunodeficiency virus [HIV] disease: Secondary | ICD-10-CM

## 2020-08-23 DIAGNOSIS — M160 Bilateral primary osteoarthritis of hip: Secondary | ICD-10-CM

## 2020-08-23 DIAGNOSIS — E1165 Type 2 diabetes mellitus with hyperglycemia: Secondary | ICD-10-CM

## 2020-08-23 DIAGNOSIS — M19072 Primary osteoarthritis, left ankle and foot: Secondary | ICD-10-CM

## 2020-08-23 DIAGNOSIS — D696 Thrombocytopenia, unspecified: Secondary | ICD-10-CM

## 2020-08-23 DIAGNOSIS — M19071 Primary osteoarthritis, right ankle and foot: Secondary | ICD-10-CM

## 2020-08-23 DIAGNOSIS — A150 Tuberculosis of lung: Secondary | ICD-10-CM

## 2020-08-23 MED ORDER — DOVATO 50-300 MG PO TABS: 1.0000 | ORAL_TABLET | Freq: Every day | ORAL | 11 refills | Status: DC

## 2020-08-23 NOTE — Patient Instructions (Addendum)
Your joint pain is likely due to the TB medications  Please give it another 2-4 weeks and see if they improve. Take tylenol or ibuprofen 600 mg up to 4 times a day as needed   Please do your blood tests first week of may and see me sometimes after that in 09/2020   HIV meds: Take tivicay one tablet once a day and descovy one tablet once a day until you receive dovato Start again dovato one tablet once a day. Prescription is forwarded Once you receive dovato, you can stop the tivicay and descovy (give back to pharmacy)   ------------------ Es probable que su dolor en las articulaciones se deba a los medicamentos para la TB.  Espere otras 2-4 semanas y vea si mejoran. Tome tylenol o ibuprofeno 600 mg hasta 4 veces al da segn sea [employer] anlisis de sangre la primera semana de [place] y visteme de vez en cuando despus de eso en 09/2020   Medicamentos para el VIH: Tome tivicay una tableta una vez al da y descubra una tableta una vez al da hasta que reciba dovato Comience de nuevo dovato una tableta una vez al da. Se enva la receta Una vez que reciba dovato, puede detener el tivicay y descovy (devolver a la Corporate investment banker)

## 2020-08-23 NOTE — Progress Notes (Signed)
Chief complaint: f/u pulm TB and hiv care  Subjective:    Patient ID: Manuel Hayes, male    DOB: [birth year], 50 y.o.   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 He was changed from rifampin to rifabutin for preparation of starting ART.  -he doesn't know how he got hiv. No hx ivdu. Heterosexual. Prior married in [place]. 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 He has labs monitored along with recent sputum  He still has some dry mouth. He denies headache, visual blurriness, eye pain, visual loss  He is eating well and gaining weight  No n/v/rash, joint pain, diarrhea.   dovato started 10/14  meds reviewed: Dovato His home dm2 meds TB meds   04/02/20 id f/u Doing well, gaining weight (110 --> 130 pounds) Feeling well, no complaint Cough much improved Not seeing his PCP at [facility] clinic as afraid he can't afford medications. Off and on lovastatin, metformin, glipizide, and lisinopril and levothyroxine Taking dovato; not taking bactrim Still getting DOT for tb meds. Finished with intensive therapy 2 months on 10/30. He is on b6, rifabutin, and inh. No side effect   12/16 id f/u HIV no missed dose pulm tb -- on DOT. No concern. He doesn't know when his last sputum testing was Patient still haven't seen his pcp at [facility] clinic (financial) He brings his pill box today dovato Bactrim Nystatin  liquid Lisinopril Metformin Valacyclovir (he is supposed to be on synthroid, lovastatin, and glipizide as well); last sugar check was very high He in the mean time developed: 1) right eye pain/redness 3 days; no f/c or decreased vision. He has an eye doctor next visit 05/2020. He is on tb treatment on continuation phase now. His last cxr 03/2020 showed scarring left lower lobe. No f/c/nightsweat/cough/weight loss. Previously has some blurry vision but initial ophthalmologic visit no concern for infection; serologic w/u syphilis/toxo/cmv negative. No penile rash/discharge 2) recently seen by urgent care for right thigh shingles given valacyclovir; has 1 more pill to take. Lesions had scabbed over; pain much better  Labs to be done today   05/31/2020 id f/u He was referred and finally saw internal medicine clinic 1/07 at [facility] for primary care He returns today for tb/hiv care [place] had resolved Never saw ophthalmology (awaiting financial aid), however eye redness/discomfort resolved No f/c/n/v/diarrhea No cough Reviewed labs. Platelet has been at just above 50 Getting DOT for tb tx Compliant no missing dose of dovato  07/24/20 id f/u Complain of bilateral hand pain in the ip joints for about 1-2 month and also ankle/toes. These are most pronounced in the heels and the fingers. No wrist, knees, elbows or back pain. Some shoulder pain No skin rash No fever, chill Pain stable, 8/10. Constant daily.  No swelling Sx improves as the day goes along. Sx worst in the morning, and after prolonged rest including stiffness No family hx rheumatoid arthritis or lupus or autoimmune disease No fatigue, oral/genital  ulcer, new visual changes/blurriness/pain No dysuria/penile discharge   08/23/20 id f/u Patient is back here today for ongoing joint pain (wrist/hands, hip, knees/heels, elbows) Finished TB treatment already on 4/06 No fever, chill Good/normal energy level No  depression/insomnia/anxiety We review labs from last visit --> crp, lft normal; wbc normal; platelet at 100s He is on bid tivicay and daily descovy right now due to tb meds  #Social review -  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 HIV disease ([place]) 02/08/2020  . Hyperlipidemia   . Pulmonary nodule   . 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  Family History  Problem Relation Age of Onset  . Diabetes Mother   . Diabetes Brother       Social History   Socioeconomic History  . Marital status: Single    Spouse name: Not on file  . Number of children: Not on file  . Years of education: Not on file  . Highest education level: Not on file  Occupational History  . Not on file  Tobacco Use  . Smoking status: Never Smoker  . Smokeless tobacco: Never Used  Substance and Sexual Activity  . Alcohol use: Not Currently  . Drug use: Never  . Sexual activity: Not Currently    Comment: pt given condoms 07/24/20  Other Topics Concern  . Not on file  Social History Narrative  . Not on file   Social Determinants of Health   Financial Resource Strain: Not on file  Food Insecurity: Not on file  Transportation Needs: Not on file  Physical Activity: Not on file  Stress: Not on file  Social Connections: Not on file    No Known Allergies   Current Outpatient Medications:  .  Alcohol Swabs PADS, Clean fingers/equipments prior to CBG testing of your fingers, Disp: 100 each, Rfl: 11 .  blood glucose meter kit and supplies KIT, Dispense based on patient and insurance preference. Use up to four times daily as directed. (FOR ICD-9 250.00, 250.01)., Disp: 1 each, Rfl: 0 .  Canagliflozin-metFORMIN HCl ER 50-1000 MG TB24, TAKE 2  TABLETS BY MOUTH IN THE MORNING., Disp: 60 tablet, Rfl: 0 .  dolutegravir (TIVICAY) 50 MG tablet, Take 1 tablet (50 mg total) by mouth 2 (two) times daily., Disp: 60 tablet, Rfl: 0 .  emtricitabine-tenofovir AF (DESCOVY) 200-25 MG tablet, Take 1 tablet by mouth daily., Disp: 30 tablet, Rfl: 3 .  glucose blood test strip, USE 1 TO CHECK BLOOD GLUCOSE UP TO 7 TIMES A WEEK. (USELO PARA CONTROLAR EL NIVEL DE AZUCAR EN LA SANGRE HASTA 7 VECES A LA [place]). (Patient taking differently: USE 1 TO CHECK BLOOD GLUCOSE UP TO 7 TIMES A WEEK. (USELO PARA CONTROLAR EL NIVEL DE AZUCAR EN LA SANGRE HASTA 7 VECES A LA [place]).), Disp: 100 strip, Rfl: 3 .  Microlet Lancets MISC, USE 1 UP TO 7 TIMES A WEEK. (USELO PARA CONTROLAR EL NIVEL DE AZUCAR EN LA SANGRE SEGUN LAS INSTRUCCIONES HASTA 7 VECES A LA [place]., Disp: 100 each, Rfl: 3 .  Omega-3 Fatty Acids (FISH OIL PO), Take by mouth., Disp: , Rfl:  .  sulfamethoxazole-trimethoprim (BACTRIM DS) 800-160 MG tablet,  Take 1 tablet by mouth daily., Disp: 90 tablet, Rfl: 1 .  emtricitabine-tenofovir AF (DESCOVY) 200-25 MG tablet, Take 1 tablet by mouth daily., Disp: 30 tablet, Rfl: 0 .  gabapentin (NEURONTIN) 100 MG capsule, Take 1 capsule (100 mg total) by mouth 3 (three) times daily. (Patient not taking: No sig reported), Disp: 90 capsule, Rfl: 2 .  glipiZIDE (GLUCOTROL XL) 2.5 MG 24 hr tablet, Take 1 tablet (2.5 mg total) by mouth daily with breakfast. (Patient not taking: No sig reported), Disp: 30 tablet, Rfl: 11 .  hydrocortisone (ANUSOL-HC) 2.5 % rectal cream, Proctosol HC 2.5 % topical cream perineal applicator  APPLY TO RECTUM 2 3 TIMES DAILY (Patient not taking: No sig reported), Disp: , Rfl:  .  levothyroxine (SYNTHROID) 25 MCG tablet, Take 1 tablet (25 mcg total) by mouth daily before breakfast. (Patient not taking: No sig reported), Disp: 30 tablet, Rfl: 11 .  lisinopril (ZESTRIL) 20 MG tablet, Take 20 mg by mouth at  bedtime. (Patient not taking: No sig reported), Disp: , Rfl:  .  lovastatin (MEVACOR) 20 MG tablet, Take 1 tablet (20 mg total) by mouth at bedtime. (Patient not taking: Reported on 08/23/2020), Disp: 30 tablet, Rfl: 11      Objective:    Vitals reviewed General/constitutional: no distress, pleasant HEENT: Normocephalic, PER, Conj Clear, EOMI, Oropharynx clear Neck supple CV: rrr no mrg Lungs: clear to auscultation, normal respiratory effort Abd: Soft, Nontender Ext: no edema Skin: No Rash Neuro: nonfocal MSK: no peripheral joint swelling/tenderness/warmth; back spines nontender     Labs: 3/09 cr 1.3; lft normal; cbc 4/13/103; tsh 4.43; a1c 6.3; ana negative; rf/ccp negative 3/09 lipid 180; 36; 103; 310  12/16 cr 1.3; lft 34/24/129/0.3; cbc 5.08/26/49; glucose 180s 11/16 lft 26/39; cr 1.5; sodium 127; cbc 08/25/70; glucose 597  01/2020 toxoplasma IgG positive; hep b sAg/sAb/cAb NR; hep C ab negative; Hep A ab reactive; cmv IgM and hsv1&2 IgM negative; vzv igg positive 01/2020 urine gc/chlam pcr negative 01/2020 rpr negative  hiv labs        cd4 (%)  /  VL 9/23              50 (3%) / 197k 11/16                          / 373 12/16           166 (6)    /  66 05/31/20                     /  25 03/0                            /  <20   Imaging: 11/16 chest xray left lung scarring      Assessment & Plan:   Patient Active Problem List   Diagnosis Date Noted  . Herpes zoster 04/22/2020  . 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 HIV disease ([place]) 02/08/2020  . Lung nodules 01/04/2020  . Pruritic rash 03/16/2018  . Anemia 12/27/2017  . Hyperproteinemia 07/05/2017  . Hypoalbuminemia 07/05/2017  . Albuminuria 11/21/2016  . Elevated liver enzymes 08/21/2016  . Benign essential hypertension 08/21/2016  . Dyslipidemia 08/21/2016  . Type 2 diabetes mellitus ([place]) 08/21/2016  . Blurring of visual image 08/21/2016    #Pulmonary TB: #thrombocytopenia, meds induced Started ATB tx  first week of 01/2020. Finished intensive tx  10/29.  AFB sputum surveillance ngative on 9/5, 6, 19, 20, 10/3, 4, 17, 18. Spoke with RN Ishmael Holter from [facility] Off ethambutol 9/16 as TB was pan-sensitive 03/2020 cxr residual scarring LLL, no cavity Developed thrombocytopenia into 50s, thought to be related to rifampin, but improved  ----------- Finished tb meds 4/6. Platelet recovered while on tb meds   #AIDS/HIV disease: started on dovato 02/29/2020. No prior therapy (new dx and referred to our clinic). No evidence Hepatitis B. Genotype 02/08/2020 no RTI or PI resistance (mutations -- RT a98S, v118I, R211k; PR I13v, d60e, l63p, v77i). He is doing very well on Dovato, and very compliant. Viral load undetectable 07/2020  -switch back to dovato now tb meds done -discuss U=U -encourage continued compliance -advise him to continue taking bactrim -- rx given -labs in 1 month   #polyarthritis Suspect tb meds related, not poncet or other autoimmune process Tsh/crp negative  -f/u 1 month and see if  Improvement or needing other w/u -crp and vit d ordered   Not discussed today ----------------- #dm2 #hlp #htn #hypothyroidism Followed by the internal medicine clinic; previously seen in [place] clinic but stopped seeing due to finance -had hhs but dm2 meds adjusted via internal medicine clinic -continue dm2 meds and f/u im clinic  --------------------------  [facility] -will need discuss hepatitis B vaccination among others when his cd4 count >200 -no cancer screening needed at this time      I spent more than 20 minute reviewing data/chart, and coordinating care and >50% direct face to face time providing counseling/discussing diagnostics/treatment plan with patient

## 2020-08-28 ENCOUNTER — Other Ambulatory Visit (HOSPITAL_COMMUNITY): Payer: Self-pay

## 2020-08-28 MED ORDER — INVOKAMET XR 50-1000 MG PO TB24
ORAL_TABLET | Freq: Every morning | ORAL | 0 refills | Status: DC
  Filled 2020-08-28: qty 60, 30d supply, fill #0

## 2020-09-05 ENCOUNTER — Other Ambulatory Visit (HOSPITAL_COMMUNITY): Payer: Self-pay

## 2020-09-06 ENCOUNTER — Ambulatory Visit: Payer: Self-pay | Attending: Internal Medicine | Admitting: Internal Medicine

## 2020-09-06 ENCOUNTER — Encounter: Payer: Self-pay | Admitting: Internal Medicine

## 2020-09-06 ENCOUNTER — Other Ambulatory Visit: Payer: Self-pay

## 2020-09-06 VITALS — BP 143/82 | HR 72 | Resp 16 | Ht 61.5 in | Wt 136.0 lb

## 2020-09-06 DIAGNOSIS — E1159 Type 2 diabetes mellitus with other circulatory complications: Secondary | ICD-10-CM | POA: Insufficient documentation

## 2020-09-06 DIAGNOSIS — D696 Thrombocytopenia, unspecified: Secondary | ICD-10-CM

## 2020-09-06 DIAGNOSIS — Z23 Encounter for immunization: Secondary | ICD-10-CM

## 2020-09-06 DIAGNOSIS — E1169 Type 2 diabetes mellitus with other specified complication: Secondary | ICD-10-CM

## 2020-09-06 DIAGNOSIS — E1165 Type 2 diabetes mellitus with hyperglycemia: Secondary | ICD-10-CM

## 2020-09-06 DIAGNOSIS — E039 Hypothyroidism, unspecified: Secondary | ICD-10-CM

## 2020-09-06 DIAGNOSIS — I152 Hypertension secondary to endocrine disorders: Secondary | ICD-10-CM

## 2020-09-06 DIAGNOSIS — E785 Hyperlipidemia, unspecified: Secondary | ICD-10-CM

## 2020-09-06 DIAGNOSIS — Z1211 Encounter for screening for malignant neoplasm of colon: Secondary | ICD-10-CM

## 2020-09-06 LAB — GLUCOSE, POCT (MANUAL RESULT ENTRY): POC Glucose: 135 mg/dl — AB (ref 70–99)

## 2020-09-06 MED ORDER — LISINOPRIL 10 MG PO TABS
10.0000 mg | ORAL_TABLET | Freq: Every day | ORAL | 3 refills | Status: DC
  Filled 2020-09-06: qty 30, 30d supply, fill #0
  Filled 2020-10-09: qty 30, 30d supply, fill #1
  Filled 2020-11-11: qty 30, 30d supply, fill #2
  Filled 2020-12-19: qty 30, 30d supply, fill #3
  Filled 2021-01-27: qty 30, 30d supply, fill #4
  Filled 2021-03-03: qty 30, 30d supply, fill #5
  Filled 2021-04-21: qty 30, 30d supply, fill #6
  Filled 2021-05-28: qty 30, 30d supply, fill #0
  Filled 2021-07-29: qty 30, 30d supply, fill #1

## 2020-09-06 MED ORDER — ATORVASTATIN CALCIUM 10 MG PO TABS
10.0000 mg | ORAL_TABLET | Freq: Every day | ORAL | 3 refills | Status: DC
  Filled 2020-09-06: qty 30, 30d supply, fill #0
  Filled 2020-10-09: qty 30, 30d supply, fill #1
  Filled 2020-11-11: qty 30, 30d supply, fill #2
  Filled 2020-12-19: qty 30, 30d supply, fill #3
  Filled 2021-01-27: qty 30, 30d supply, fill #4
  Filled 2021-03-03: qty 30, 30d supply, fill #5
  Filled 2021-04-21: qty 30, 30d supply, fill #6
  Filled 2021-05-28: qty 30, 30d supply, fill #0
  Filled 2021-07-29: qty 90, 90d supply, fill #1

## 2020-09-06 MED ORDER — INVOKAMET XR 50-1000 MG PO TB24
ORAL_TABLET | ORAL | 1 refills | Status: DC
  Filled 2020-09-06: qty 30, 30d supply, fill #0
  Filled 2020-10-09: qty 30, 30d supply, fill #1
  Filled 2020-11-11: qty 60, 60d supply, fill #2
  Filled 2020-11-11: qty 30, 30d supply, fill #2
  Filled 2020-12-19: qty 30, 30d supply, fill #3

## 2020-09-06 NOTE — Patient Instructions (Addendum)
Vacuna antineumoccica de polisacridos (PPSV23): lo que debe saber Pneumococcal Polysaccharide Vaccine (PPSV23): What You Need to Know 1. Por qu vacunarse? La vacuna antineumoccica de polisacridos (PPSV23) puede prevenir la enfermedad neumoccica. Enfermedad neumoccica hace referencia a cualquier enfermedad causada por bacterias neumoccicas. Estas bacterias pueden causar muchos tipos de enfermedades, entre ellas neumona, que es una infeccin de los pulmones. Las bacterias neumoccicas son una de las causas ms comunes de la neumona. Adems de neumona, las bacterias neumoccicas tambin pueden causar lo siguiente:  Infecciones en los odos  Infecciones de los senos paranasales  Meningitis (infeccin del tejido que recubre el cerebro y la mdula espinal)  Bacteriemia (infeccin en el torrente sanguneo) Cualquier persona puede contraer la enfermedad neumoccica, pero los nios menores de 2 aos, las personas con ciertas enfermedades, los adultos mayores de 65 aos y los fumadores son los que corren un mayor riesgo. La mayora de las infecciones neumoccicas son leves. Sin embargo, algunas pueden causar problemas a largo plazo, como dao cerebral o prdida de la audicin. La meningitis, la bacteriemia y la neumona causadas por la enfermedad neumoccica pueden ser mortales. 2. PPSV23 La vacuna PPSV23 protege contra 23 tipos de bacterias que causan la enfermedad neumoccica. La vacuna PPSV23 se recomienda para las siguientes personas:  Todos los adultos de 65 aos o ms.  Cualquier persona de 2 aos o ms con ciertas enfermedades que pueden provocar un mayor riesgo de enfermedad neumoccica. La mayora de las personas solo necesitan una dosis de la vacuna PPSV23. Para ciertos grupos de alto riesgo, se recomienda una segunda dosis de PPSV23 y otro tipo de vacuna antineumoccica llamada PCV13. Su mdico puede darle ms informacin. Las personas mayores de 65 aos deben recibir una dosis  de la vacuna PPSV23, aun si ya recibieron una o ms dosis antes de cumplir los 65aos. 3. Hable con el mdico Comunquese con la persona que le coloca las vacunas si la persona que la recibe:  Ha tenido una reaccin alrgica despus de una dosis previa de la vacuna PPSV23 o tiene alguna alergia grave, potencialmente mortal. En algunos casos, es posible que el mdico decida posponer la aplicacin de la vacuna PPSV23 para una visita en el futuro. Las personas que sufren trastornos menores, como un resfro, pueden vacunarse. Las personas que tienen enfermedades moderadas o graves generalmente deben esperar hasta recuperarse para poder vacunarse con la PPSV23. Su mdico puede darle ms informacin. 4. Riesgos de una reaccin a la vacuna  Puede tener enrojecimiento o dolor en el lugar de la inyeccin, cansancio, fiebre o dolores musculares despus de recibir la vacuna PPSV23. Las personas a veces se desmayan despus de procedimientos mdicos, incluida la vacunacin. Informe al mdico si se siente mareado, tiene cambios en la visin o zumbidos en los odos. Al igual que con cualquier medicamento, existe una probabilidad muy remota de que una vacuna cause una reaccin alrgica grave, otra lesin grave o la muerte. 5. Qu pasa si se presenta un problema grave? Podra producirse una reaccin alrgica despus de que la persona vacunada abandone la clnica. Si observa signos de una reaccin alrgica grave (ronchas, hinchazn de la cara y la garganta, dificultad para respirar, latidos cardacos acelerados, mareos o debilidad), llame al 9-1-1 y lleve a la persona al hospital ms cercano. Si se presentan otros signos que le preocupan, comunquese con su mdico. Las reacciones adversas deben informarse al Sistema de Informe de Eventos Adversos de Vacunas (Vaccine Adverse Event Reporting System, VAERS). Por lo general, el mdico presenta este   informe o puede hacerlo usted mismo. Visite el sitio web del VAERS en  www.vaers.hhs.gov o llame al 1-800-822-7967. El VAERS es solo para informar reacciones; su personal no proporciona asesoramiento mdico. 6. Cmo puedo obtener ms informacin?  Pregntele a su mdico.  Comunquese con el servicio de salud de su localidad o su estado.  Comunquese con los Centers for Disease Control and Prevention, CDC (Centros para el Control y la Prevencin de Enfermedades): ? Llame al 1-800-232-4636 (1-800-CDC-INFO) o ? Visite el sitio web de los CDC en www.cdc.gov/vaccines Declaracin de informacin de vacunas sobre la vacuna PPSV23 (30/02/2018) Esta informacin no tiene como fin reemplazar el consejo del mdico. Asegrese de hacerle al mdico cualquier pregunta que tenga. Document Revised: 02/20/2020 Document Reviewed: 01/08/2020 Elsevier Patient Education  2021 Elsevier Inc.  

## 2020-09-06 NOTE — Progress Notes (Signed)
Patient ID: Manuel Hayes, male    DOB: 1971-01-03  MRN: [id number]  CC: New Patient (Initial Visit) and Diabetes   Subjective: Manuel Hayes is a 50 y.o. male who presents for new patient visit. 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 Followed by ID for HIV.  Reports compliance with medications.  Last viral load undetectable.  DIABETES TYPE 2 Last A1C:   Results for orders placed or performed in visit on 09/06/20  POCT glucose (manual entry)  Result Value Ref Range   POC Glucose 135 (A) 70 - 99 mg/dl    Lab Results  Component Value Date   HGBA1C 6.3 (H) 07/24/2020   Diagnosed in 2016 Med Adherence:  []  Yes    [x]  No.  Suppose to be on Glipizide and Invokamet twice a day.  Glipizide [facility] by [facility].  Ran out of the other med 2 wks ago   Medication side effects:  []  Yes    []  No Home Monitoring?  [x]  Yes 2-3 x/wk Home glucose results range: highest is 150. Diet Adherence: [x]  Yes    []  No Exercise: [x]  Yes - 3-4/wk Hypoglycemic episodes?: []  Yes    [x]  No Numbness of the feet? []  Yes    [x]  No Retinopathy hx? []  Yes    []  No Last eye exam: reports having eye exam 02/2020.  Comments:   HTN:  Out of Lisinopril for 1 mth No CP/SOB/HA/dizziness Limits salt in foods  HL: out of [place].  Recent LDL 103  Noted to have low PLT count starting on CBC done 5 mths ago.  Last 3 readings 72, 51 and 103 with the latter being the most recent reading. No bruising or bleeding.  Hypothyroid: suppose to be on Levothyroxine 25 mcg.  Ran out 01/2020. Last TSH 4.43 done 07/24/2020.    HM:  Never had c-scope.  No fhx of colon CA.  Need for Pneumovax and tdap.  Patient Active Problem List   Diagnosis Date Noted  . Need for tetanus, diphtheria, and acellular pertussis (Tdap) vaccine 09/06/2020  . Herpes zoster 04/22/2020  . Pulmonary  tuberculosis 02/08/2020  . HIV disease ([place]) 02/08/2020  . Lung nodules 01/04/2020  . Pruritic rash 03/16/2018  . Anemia 12/27/2017  . Hyperproteinemia 07/05/2017  . Hypoalbuminemia 07/05/2017  . Albuminuria 11/21/2016  . Elevated liver enzymes 08/21/2016  . Benign essential hypertension 08/21/2016  . Dyslipidemia 08/21/2016  . Type 2 diabetes mellitus ([place]) 08/21/2016  . Blurring of visual image 08/21/2016     Current Outpatient Medications on File Prior to Visit  Medication Sig Dispense Refill  . Alcohol Swabs PADS Clean fingers/equipments prior to CBG testing of your fingers 100 each 11  . blood glucose meter kit and supplies KIT Dispense based on patient and insurance preference. Use up to four times daily as directed. (FOR ICD-9 250.00, 250.01). 1 each 0  . Canagliflozin-metFORMIN HCl ER (INVOKAMET XR) 50-1000 MG TB24 TAKE 2 TABLETS BY MOUTH IN THE MORNING. 60 tablet 0  . Dolutegravir-lamiVUDine (DOVATO) 50-300 MG TABS Take 1 tablet by mouth daily. 30 tablet 11  . gabapentin (NEURONTIN) 100 MG capsule Take 1 capsule (100 mg total) by mouth 3 (three) times daily. (Patient not taking: No sig reported) 90 capsule 2  . glipiZIDE (GLUCOTROL XL) 2.5 MG 24 hr tablet Take 1 tablet (2.5 mg total) by mouth daily  with breakfast. (Patient not taking: No sig reported) 30 tablet 11  . glucose blood test strip USE 1 TO CHECK BLOOD GLUCOSE UP TO 7 TIMES A WEEK. (USELO PARA CONTROLAR EL NIVEL DE AZUCAR EN LA SANGRE HASTA 7 VECES A LA [place]). (Patient taking differently: USE 1 TO CHECK BLOOD GLUCOSE UP TO 7 TIMES A WEEK. (USELO PARA CONTROLAR EL NIVEL DE AZUCAR EN LA SANGRE HASTA 7 VECES A LA [place]).) 100 strip 3  . hydrocortisone (ANUSOL-HC) 2.5 % rectal cream Proctosol HC 2.5 % topical cream perineal applicator  APPLY TO RECTUM 2 3 TIMES DAILY (Patient not taking: No sig reported)    . levothyroxine (SYNTHROID) 25 MCG tablet Take 1 tablet (25 mcg  total) by mouth daily before breakfast. (Patient not taking: No sig reported) 30 tablet 11  . lisinopril (ZESTRIL) 20 MG tablet Take 20 mg by mouth at bedtime. (Patient not taking: No sig reported)    . lovastatin (MEVACOR) 20 MG tablet Take 1 tablet (20 mg total) by mouth at bedtime. (Patient not taking: Reported on 08/23/2020) 30 tablet 11  . Microlet Lancets MISC USE 1 UP TO 7 TIMES A WEEK. (USELO PARA CONTROLAR EL NIVEL DE AZUCAR EN LA SANGRE SEGUN LAS INSTRUCCIONES HASTA 7 VECES A LA [place]. 100 each 3  . Omega-3 Fatty Acids (FISH OIL PO) Take by mouth.    . sulfamethoxazole-trimethoprim (BACTRIM DS) 800-160 MG tablet Take 1 tablet by mouth daily. 90 tablet 1   No current facility-administered medications on file prior to visit.    No Known Allergies  Social History   Socioeconomic History  . Marital status: Single    Spouse name: Not on file  . Number of children: Not on file  . Years of education: Not on file  . Highest education level: Not on file  Occupational History  . Not on file  Tobacco Use  . Smoking status: Never Smoker  . Smokeless tobacco: Never Used  Substance and Sexual Activity  . Alcohol use: Not Currently  . Drug use: Never  . Sexual activity: Not Currently    Comment: pt given condoms 08/23/20  Other Topics Concern  . Not on file  Social History Narrative  . Not on file   Social Determinants of Health   Financial Resource Strain: Not on file  Food Insecurity: Not on file  Transportation Needs: Not on file  Physical Activity: Not on file  Stress: Not on file  Social Connections: Not on file  Intimate Partner Violence: Not on file    Family History  Problem Relation Age of Onset  . Diabetes Mother   . Diabetes Brother     No past surgical history on file.  ROS: Review of Systems Negative except as stated above  PHYSICAL EXAM: BP (!) 143/82   Pulse 72   Resp 16   Ht 5' 1.5" (1.562 m)   Wt 136 lb (61.7 kg)   SpO2 98%   BMI 25.28 kg/m    Wt Readings from Last 3 Encounters:  09/06/20 136 lb (61.7 kg)  08/23/20 133 lb (60.3 kg)  07/24/20 137 lb (62.1 kg)  BP 150/78  Physical Exam  General appearance - alert, well appearing, middle-aged Hispanic male and in no distress Mental status - normal mood, behavior, speech, dress, motor activity, and thought processes Neck - supple, no significant adenopathy Chest - clear to auscultation, no wheezes, rales or rhonchi, symmetric air entry Heart - normal rate,  regular rhythm, normal S1, S2, no murmurs, rubs, clicks or gallops Extremities - peripheral pulses normal, no pedal edema, no clubbing or cyanosis Diabetic Foot Exam - Simple   Simple Foot Form Visual Inspection See comments: Yes Sensation Testing Intact to touch and monofilament testing bilaterally: Yes Pulse Check Posterior Tibialis and Dorsalis pulse intact bilaterally: Yes Comments      CMP Latest Ref Rng & Units 07/24/2020 05/02/2020 04/02/2020  Glucose 65 - 99 mg/dL 161(H) 180(H) 597(HH)  BUN 7 - 25 mg/dL 20 26(H) 21  Creatinine 0.70 - 1.33 mg/dL 1.29 1.31 1.53(H)  Sodium 135 - 146 mmol/L 139 133(L) 127(L)  Potassium 3.5 - 5.3 mmol/L 3.9 3.9 4.2  Chloride 98 - 110 mmol/L 102 102 98  CO2 20 - 32 mmol/L 29 26 23   Calcium 8.6 - 10.3 mg/dL 9.1 9.0 8.4(L)  Total Protein 6.1 - 8.1 g/dL 8.7(H) 9.0(H) 9.3(H)  Total Bilirubin 0.2 - 1.2 mg/dL 0.3 0.3 0.2  Alkaline Phos 38 - 126 U/L - - -  AST 10 - 35 U/L 26 34 39(H)  ALT 9 - 46 U/L 16 24 26    Lipid Panel     Component Value Date/Time   CHOL 180 07/24/2020 1424   TRIG 310 (H) 07/24/2020 1424   HDL 36 (L) 07/24/2020 1424   CHOLHDL 5.0 (H) 07/24/2020 1424   LDLCALC 103 (H) 07/24/2020 1424    CBC    Component Value Date/Time   WBC 4.0 07/24/2020 1424   RBC 4.14 (L) 07/24/2020 1424   HGB 13.1 (L) 07/24/2020 1424   HCT 38.6 07/24/2020 1424   PLT 103 (L) 07/24/2020 1424   MCV 93.2 07/24/2020 1424   MCH 31.6 07/24/2020 1424   MCHC 33.9 07/24/2020 1424   RDW  12.0 07/24/2020 1424   LYMPHSABS 1,792 07/24/2020 1424   MONOABS 0.9 11/20/2019 0246   EOSABS 108 07/24/2020 1424   BASOSABS 20 07/24/2020 1424    ASSESSMENT AND PLAN:  1. Type 2 diabetes mellitus with hyperglycemia, without long-term current use of insulin (HCC) Recent A1c at goal.  Restart Invokamet with once a day dosing.  Continue to monitor blood sugars with goal being 90-130 before meals.  Discussed and encourage him to continue regular exercise and healthy eating habits. - POCT glucose (manual entry) - Microalbumin / creatinine urine ratio - Canagliflozin-metFORMIN HCl ER (INVOKAMET XR) 50-1000 MG TB24; 1 tab PO daily  Dispense: 90 tablet; Refill: 1  2. Hypertension associated with diabetes ([place]) Not at goal.  Out of lisinopril.  Refill given but at a lower dose of 10 mg daily.  DASH diet encouraged - lisinopril (ZESTRIL) 10 MG tablet; Take 1 tablet (10 mg total) by mouth daily.  Dispense: 90 tablet; Refill: 3  3. Hyperlipidemia associated with type 2 diabetes mellitus (HCC) LDL not at goal of 70.  We will put him on atorvastatin instead of Mevacor - atorvastatin (LIPITOR) 10 MG tablet; Take 1 tablet (10 mg total) by mouth daily.  Dispense: 90 tablet; Refill: 3  4. Acquired hypothyroidism Recheck thyroid level.  If continues to be normal then I will leave him off the levothyroxine - TSH+T4F+T3Free  5. Screening for colon cancer Discussed colon cancer screening.  He is agreeable to having fit test.  He is at average risk for colon cancer. - Fecal occult blood, imunochemical([facility])  6. Need for vaccination against Streptococcus pneumoniae Given today.  7.  Thrombocytopenia May have been due to active TB.  Platelet counts have improved.  We will  continue to monitor.  Patient was given the opportunity to ask questions.  Patient verbalized understanding of the plan and was able to repeat key elements of the plan.  [employer] Language interpreter used during this encounter.  #[id number] Hurry  Orders Placed This Encounter  Procedures  . Microalbumin / creatinine urine ratio  . POCT glucose (manual entry)     Requested Prescriptions    No prescriptions requested or ordered in this encounter    No follow-ups on file.  Karle Plumber, MD, FACP

## 2020-09-07 ENCOUNTER — Encounter: Payer: Self-pay | Admitting: Internal Medicine

## 2020-09-07 LAB — MICROALBUMIN / CREATININE URINE RATIO
Creatinine, Urine: 177.1 mg/dL
Microalb/Creat Ratio: 2700 mg/g creat — ABNORMAL HIGH (ref 0–29)
Microalbumin, Urine: 4781.5 ug/mL

## 2020-09-07 LAB — TSH+T4F+T3FREE
Free T4: 1.04 ng/dL (ref 0.82–1.77)
T3, Free: 3.7 pg/mL (ref 2.0–4.4)
TSH: 7.71 u[IU]/mL — ABNORMAL HIGH (ref 0.450–4.500)

## 2020-09-07 NOTE — Progress Notes (Signed)
Let pt know that he has increase protein in the urine which is likely due to diabetes affecting the kidney.  The blood pressure medicine lisinopril which I refilled on his recent visit will help to protect the kidneys.  His thyroid level is mildly abnormal.  We will plan to recheck the level on his follow-up visit in 3 to 4 months.  If it is worse at that time, we will need to restart the thyroid medication.

## 2020-09-09 ENCOUNTER — Telehealth: Payer: Self-pay | Admitting: Internal Medicine

## 2020-09-09 NOTE — Telephone Encounter (Signed)
Pt states he received a voice message regarding some results. Pt was returning the call. Pt asked if he could be called tomorrow 09/10/20 instead because he cannot take personal calls at work. Please advise and thank you

## 2020-09-10 ENCOUNTER — Other Ambulatory Visit: Payer: Self-pay

## 2020-09-10 NOTE — Telephone Encounter (Signed)
[employer] interpre[employer] [place] Mons Id# [id number]  contacted pt to go over lab results pt is aware and doesn't have any questions or concerns

## 2020-09-11 ENCOUNTER — Other Ambulatory Visit: Payer: Self-pay

## 2020-09-11 LAB — FECAL OCCULT BLOOD, IMMUNOCHEMICAL: Fecal Occult Bld: NEGATIVE

## 2020-09-12 ENCOUNTER — Telehealth: Payer: Self-pay

## 2020-09-12 NOTE — Telephone Encounter (Signed)
Contacted pt to go over lab results pt didn't answer and no vm was set up 

## 2020-09-13 ENCOUNTER — Other Ambulatory Visit: Payer: Self-pay

## 2020-09-13 ENCOUNTER — Ambulatory Visit: Payer: Self-pay | Attending: Internal Medicine

## 2020-09-13 ENCOUNTER — Other Ambulatory Visit: Payer: Self-pay | Admitting: Internal Medicine

## 2020-09-13 DIAGNOSIS — B20 Human immunodeficiency virus [HIV] disease: Secondary | ICD-10-CM

## 2020-09-13 DIAGNOSIS — M2550 Pain in unspecified joint: Secondary | ICD-10-CM

## 2020-09-16 LAB — COMPREHENSIVE METABOLIC PANEL
AG Ratio: 0.6 (calc) — ABNORMAL LOW (ref 1.0–2.5)
ALT: 18 U/L (ref 9–46)
AST: 24 U/L (ref 10–35)
Albumin: 3.2 g/dL — ABNORMAL LOW (ref 3.6–5.1)
Alkaline phosphatase (APISO): 150 U/L — ABNORMAL HIGH (ref 35–144)
BUN/Creatinine Ratio: 30 (calc) — ABNORMAL HIGH (ref 6–22)
BUN: 34 mg/dL — ABNORMAL HIGH (ref 7–25)
CO2: 27 mmol/L (ref 20–32)
Calcium: 9.4 mg/dL (ref 8.6–10.3)
Chloride: 103 mmol/L (ref 98–110)
Creat: 1.15 mg/dL (ref 0.70–1.33)
Globulin: 5.3 g/dL (calc) — ABNORMAL HIGH (ref 1.9–3.7)
Glucose, Bld: 152 mg/dL — ABNORMAL HIGH (ref 65–99)
Potassium: 4 mmol/L (ref 3.5–5.3)
Sodium: 136 mmol/L (ref 135–146)
Total Bilirubin: 0.3 mg/dL (ref 0.2–1.2)
Total Protein: 8.5 g/dL — ABNORMAL HIGH (ref 6.1–8.1)

## 2020-09-16 LAB — CBC WITH DIFFERENTIAL/PLATELET
Absolute Monocytes: 744 cells/uL (ref 200–950)
Basophils Absolute: 31 cells/uL (ref 0–200)
Basophils Relative: 0.5 %
Eosinophils Absolute: 118 cells/uL (ref 15–500)
Eosinophils Relative: 1.9 %
HCT: 43.2 % (ref 38.5–50.0)
Hemoglobin: 14.2 g/dL (ref 13.2–17.1)
Lymphs Abs: 1972 cells/uL (ref 850–3900)
MCH: 29.7 pg (ref 27.0–33.0)
MCHC: 32.9 g/dL (ref 32.0–36.0)
MCV: 90.4 fL (ref 80.0–100.0)
MPV: 12.3 fL (ref 7.5–12.5)
Monocytes Relative: 12 %
Neutro Abs: 3336 cells/uL (ref 1500–7800)
Neutrophils Relative %: 53.8 %
Platelets: 124 10*3/uL — ABNORMAL LOW (ref 140–400)
RBC: 4.78 10*6/uL (ref 4.20–5.80)
RDW: 12.5 % (ref 11.0–15.0)
Total Lymphocyte: 31.8 %
WBC: 6.2 10*3/uL (ref 3.8–10.8)

## 2020-09-16 LAB — VITAMIN D 25 HYDROXY (VIT D DEFICIENCY, FRACTURES): Vit D, 25-Hydroxy: 23 ng/mL — ABNORMAL LOW (ref 30–100)

## 2020-09-16 LAB — C-REACTIVE PROTEIN: CRP: 5.5 mg/L (ref <8.0)

## 2020-09-16 LAB — T-HELPER CELLS (CD4) COUNT (NOT AT ARMC)
Absolute CD4: 127 cells/uL — ABNORMAL LOW (ref 490–1740)
CD4 T Helper %: 5 % — ABNORMAL LOW (ref 30–61)
Total lymphocyte count: 2461 cells/uL (ref 850–3900)

## 2020-09-16 LAB — CK: Total CK: 194 U/L (ref 44–196)

## 2020-09-16 LAB — HIV-1 RNA QUANT-NO REFLEX-BLD
HIV 1 RNA Quant: 47 Copies/mL — ABNORMAL HIGH
HIV-1 RNA Quant, Log: 1.67 Log cps/mL — ABNORMAL HIGH

## 2020-09-18 ENCOUNTER — Other Ambulatory Visit: Payer: No Typology Code available for payment source

## 2020-09-25 ENCOUNTER — Ambulatory Visit: Payer: No Typology Code available for payment source | Admitting: Internal Medicine

## 2020-10-01 ENCOUNTER — Telehealth: Payer: Self-pay | Admitting: Internal Medicine

## 2020-10-01 NOTE — Telephone Encounter (Signed)
Pt was sent a letter from financial dept. Inform them, that the application they submitted was incomplete, since they were missing some documentation at the time of the appointment, Pt need to reschedule and resubmit all new papers and application for [employer]FA and [employer], P.S. old documents has been sent back by mail to the Pt and Pt. need to make a new appt. 

## 2020-10-02 ENCOUNTER — Encounter: Payer: Self-pay | Admitting: Internal Medicine

## 2020-10-02 ENCOUNTER — Ambulatory Visit (INDEPENDENT_AMBULATORY_CARE_PROVIDER_SITE_OTHER): Payer: No Typology Code available for payment source | Admitting: Internal Medicine

## 2020-10-02 ENCOUNTER — Other Ambulatory Visit: Payer: Self-pay

## 2020-10-02 VITALS — BP 131/79 | HR 75 | Temp 98.5°F | Wt 135.0 lb

## 2020-10-02 DIAGNOSIS — B20 Human immunodeficiency virus [HIV] disease: Secondary | ICD-10-CM

## 2020-10-02 NOTE — Progress Notes (Signed)
Chief complaint: f/u pulm TB and hiv care  Subjective:    Patient ID: Manuel Hayes, male    DOB: [birth year], 50 y.o.   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 He was changed from rifampin to rifabutin for preparation of starting ART.  -he doesn't know how he got hiv. No hx ivdu. Heterosexual. Prior married in [place]. 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 He has labs monitored along with recent sputum  He still has some dry mouth. He denies headache, visual blurriness, eye pain, visual loss  He is eating well and gaining weight  No n/v/rash, joint pain, diarrhea.   dovato started 10/14  meds reviewed: Dovato His home dm2 meds TB meds   04/02/20 id f/u Doing well, gaining weight (110 --> 130 pounds) Feeling well, no complaint Cough much improved Not seeing his PCP at [facility] as afraid he can't afford medications. Off and on lovastatin, metformin, glipizide, and lisinopril and levothyroxine Taking dovato; not taking bactrim Still getting DOT for tb meds. Finished with intensive therapy 2 months on 10/30. He is on b6, rifabutin, and inh. No side effect   12/16 id f/u HIV no missed dose pulm tb -- on DOT. No concern. He doesn't know when his last sputum testing was Patient still haven't seen his pcp at [facility] (financial) He brings his pill box today dovato Bactrim Nystatin  liquid Lisinopril Metformin Valacyclovir (he is supposed to be on synthroid, lovastatin, and glipizide as well); last sugar check was very high He in the mean time developed: 1) right eye pain/redness 3 days; no f/c or decreased vision. He has an eye doctor next visit 05/2020. He is on tb treatment on continuation phase now. His last cxr 03/2020 showed scarring left lower lobe. No f/c/nightsweat/cough/weight loss. Previously has some blurry vision but initial ophthalmologic visit no concern for infection; serologic w/u syphilis/toxo/cmv negative. No penile rash/discharge 2) recently seen by urgent care for right thigh shingles given valacyclovir; has 1 more pill to take. Lesions had scabbed over; pain much better  Labs to be done today   05/31/2020 id f/u He was referred and finally saw internal medicine clinic 1/07 at [facility] for primary care He returns today for tb/hiv care [place] had resolved Never saw ophthalmology (awaiting financial aid), however eye redness/discomfort resolved No f/c/n/v/diarrhea No cough Reviewed labs. Platelet has been at just above 50 Getting DOT for tb tx Compliant no missing dose of dovato  07/24/20 id f/u Complain of bilateral hand pain in the ip joints for about 1-2 month and also ankle/toes. These are most pronounced in the heels and the fingers. No wrist, knees, elbows or back pain. Some shoulder pain No skin rash No fever, chill Pain stable, 8/10. Constant daily.  No swelling Sx improves as the day goes along. Sx worst in the morning, and after prolonged rest including stiffness No family hx rheumatoid arthritis or lupus or autoimmune disease No fatigue, oral/genital  ulcer, new visual changes/blurriness/pain No dysuria/penile discharge   08/23/20 id f/u Patient is back here today for ongoing joint pain (wrist/hands, hip, knees/heels, elbows) Finished TB treatment already on 4/06 No fever, chill Good/normal energy level No  depression/insomnia/anxiety We review labs from last visit --> crp, lft normal; wbc normal; platelet at 100s He is on bid tivicay and daily descovy right now due to tb meds    10/02/20 Doing well Bilateral hands pain chronic No f/c/n/v/diarrhea Takes dovato everyday no missed dose  Hx via language interpreter   #Social review -  No change 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 HIV disease ([place]) 02/08/2020  . Hyperlipidemia   . Pulmonary nodule   . 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  Family History  Problem Relation Age of Onset  . Diabetes Mother   . Diabetes Brother       Social History   Socioeconomic History  . Marital status: Single    Spouse name: Not on file  . Number of children: Not on file  . Years of education: Not on file  . Highest education level: Not on file  Occupational History  . Not on file  Tobacco Use  . Smoking status: Never Smoker  . Smokeless tobacco: Never Used  Substance and Sexual Activity  . Alcohol use: Not Currently  . Drug use: Never  . Sexual activity: Not Currently  Other Topics Concern  . Not on file  Social History Narrative  . Not on file   Social Determinants of Health   Financial Resource Strain: Not on file  Food Insecurity: Not on file  Transportation Needs: Not on file  Physical Activity: Not on file  Stress: Not on file  Social Connections: Not on file    No Known Allergies   Current Outpatient Medications:  .  atorvastatin (LIPITOR) 10 MG tablet, Take 1 tablet (10 mg total) by mouth daily., Disp: 90 tablet, Rfl: 3 .  Canagliflozin-metFORMIN HCl ER (INVOKAMET XR) 50-1000 MG TB24, TAKE 1 TABLET BY MOUTH DAILY (Patient taking differently:  TAKE 1 TABLET BY MOUTH DAILY), Disp: 90 tablet, Rfl: 1 .  Dolutegravir-lamiVUDine (DOVATO) 50-300 MG TABS, Take 1 tablet by mouth daily., Disp: 30 tablet, Rfl: 11 .  lisinopril (ZESTRIL) 10 MG tablet, Take 1 tablet (10 mg total) by mouth daily., Disp: 90 tablet, Rfl: 3 .  Alcohol Swabs PADS, Clean fingers/equipments prior to CBG testing of your fingers, Disp: 100 each, Rfl: 11 .  blood glucose meter kit and supplies KIT, Dispense based on patient and insurance preference. Use up to four times daily as directed. (FOR ICD-9 250.00, 250.01)., Disp: 1 each, Rfl: 0 .  gabapentin (NEURONTIN) 100 MG capsule, Take 1 capsule (100 mg total) by mouth 3 (three) times daily. (Patient not taking: No sig reported), Disp: 90 capsule, Rfl: 2 .  glucose blood test strip, USE 1 TO CHECK BLOOD GLUCOSE UP TO 7 TIMES A WEEK. (USELO PARA CONTROLAR EL NIVEL DE AZUCAR EN LA SANGRE HASTA 7 VECES A LA [place]). (Patient taking differently: USE 1 TO CHECK BLOOD GLUCOSE UP TO 7 TIMES A WEEK. ([place]  HASTA 7 VECES A LA SEMANA SEGUN LAS INSTRUCCIONES).), Disp: 100 strip, Rfl: 3 .  hydrocortisone (ANUSOL-HC) 2.5 % rectal cream, Proctosol HC 2.5 % topical cream perineal applicator  APPLY TO RECTUM 2 3 TIMES DAILY (Patient not taking: No sig reported), Disp: , Rfl:  .  levothyroxine (SYNTHROID) 25 MCG tablet, Take 1 tablet (25 mcg total) by mouth daily before breakfast. (Patient not taking: No sig reported), Disp: 30 tablet, Rfl: 11 .  Microlet Lancets MISC, USE 1 UP TO 7 TIMES A WEEK. (USELO PARA CONTROLAR EL NIVEL DE AZUCAR EN LA SANGRE SEGUN LAS INSTRUCCIONES HASTA 7 VECES A LA [place]., Disp: 100 each, Rfl: 3 .  Omega-3 Fatty Acids (FISH OIL PO), Take by mouth., Disp: , Rfl:  .  sulfamethoxazole-trimethoprim (BACTRIM DS) 800-160 MG tablet, Take 1 tablet by mouth daily., Disp: 90 tablet, Rfl: 1      Objective:    Vitals reviewed General/constitutional: no  distress, pleasant HEENT: Normocephalic, PER, Conj Clear, EOMI, Oropharynx clear Neck supple CV: rrr no mrg Lungs: clear to auscultation, normal respiratory effort Abd: Soft, Nontender Ext: no edema Skin: No Rash Neuro: nonfocal MSK: no peripheral joint swelling/tenderness/warmth; back spines nontender       Labs: Lab Results  Component Value Date   WBC 6.2 09/13/2020   HGB 14.2 09/13/2020   HCT 43.2 09/13/2020   MCV 90.4 09/13/2020   PLT 124 (L) 32/95/1884   Last metabolic panel Lab Results  Component Value Date   GLUCOSE 152 (H) 09/13/2020   NA 136 09/13/2020   K 4.0 09/13/2020   CL 103 09/13/2020   CO2 27 09/13/2020   BUN 34 (H) 09/13/2020   CREATININE 1.15 09/13/2020   GFRNONAA 63 07/24/2020   GFRAA 73 07/24/2020   CALCIUM 9.4 09/13/2020   PROT 8.5 (H) 09/13/2020   ALBUMIN 2.2 (L) 11/20/2019   BILITOT 0.3 09/13/2020   ALKPHOS 331 (H) 11/20/2019   AST 24 09/13/2020   ALT 18 09/13/2020   ANIONGAP 6 11/20/2019    3/09 cr 1.3; lft normal; cbc 4/13/103; tsh 4.43; a1c 6.3; ana negative; rf/ccp negative 3/09 lipid 180; 36; 103; 310  Serology: 01/2020 toxoplasma IgG positive; hep b sAg/sAb/cAb NR; hep C ab negative; Hep A ab reactive; cmv IgM and hsv1&2 IgM negative; vzv igg positive 01/2020 urine gc/chlam pcr negative 01/2020 rpr negative  hiv labs        cd4 (%)  /  VL 9/23              50 (3%) / 197k 11/16                          / 373 12/16           166 (6)    /  66 05/31/20                     /  25 07/2020        129 (8)   /  <20 04/29                          /  47   Imaging: 11/16 chest xray left lung scarring  08/21/20 chest xray Redemonstration of nodularity of the left lung base, unchanged from prior chest x-ray and CT, with no evidence of acute cardiopulmonary disease      Assessment & Plan:   Patient Active Problem  List   Diagnosis Date Noted  . Need for tetanus, diphtheria, and acellular pertussis (Tdap) vaccine 09/06/2020  .  Hypertension associated with diabetes ([place]) 09/06/2020  . Acquired hypothyroidism 09/06/2020  . Herpes zoster 04/22/2020  . 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 HIV disease ([place]) 02/08/2020  . Lung nodules 01/04/2020  . Pruritic rash 03/16/2018  . Anemia 12/27/2017  . Hyperproteinemia 07/05/2017  . Hypoalbuminemia 07/05/2017  . Positive for macroalbuminuria 11/21/2016  . Elevated liver enzymes 08/21/2016  . Benign essential hypertension 08/21/2016  . Hyperlipidemia associated with type 2 diabetes mellitus ([place]) 08/21/2016  . Type 2 diabetes mellitus ([place]) 08/21/2016  . Blurring of visual image 08/21/2016    #Pulmonary TB: #thrombocytopenia, meds induced Started ATB tx first week of 01/2020. Finished intensive tx 10/29.  AFB sputum surveillance ngative on 9/5, 6, 19, 20, 10/3, 4, 17, 18. Spoke with RN Ishmael Holter from [facility] Off ethambutol 9/16 as TB was pan-sensitive 03/2020 cxr residual scarring LLL, no cavity Developed thrombocytopenia into 50s, thought to be related to rifampin, but improved  ----------- Finished tb meds 4/6. Platelet recovered while on tb meds   #AIDS/HIV disease: started on dovato 02/29/2020. No prior therapy (new dx and referred to our clinic). No evidence Hepatitis B. Genotype 02/08/2020 no RTI or PI resistance (mutations -- RT a98S, v118I, R211k; PR I13v, d60e, l63p, v77i). He is doing very well on Dovato, and very compliant. Viral load undetectable 07/2020  Blip in 08/2020. Discuss need for compliance. He said he takes everyday  -continue dovato -discuss U=U -encourage continued compliance -advise him to continue taking bactrim -- rx given -labs in 1 month   #hands pain ddx neuropathy/cts/vitamin b12 deficiency  -f/u pcp   #polyarthritis Suspect tb meds related, not poncet or other autoimmune process Tsh/crp negative  -inactive   Not discussed today ----------------- #dm2 #hlp #htn #hypothyroidism Followed by the internal medicine clinic;  previously seen in [facility] but stopped seeing due to finance -had hhs but dm2 meds adjusted via internal medicine clinic -continue dm2 meds and f/u im clinic  --------------------------  [facility] -will need discuss hepatitis B vaccination among others when his cd4 count >200 -no cancer screening needed at this time     I spent more than 20 minute reviewing data/chart, and coordinating care and >50% direct face to face time providing counseling/discussing diagnostics/treatment plan with patient

## 2020-10-02 NOTE — Patient Instructions (Signed)
Take your dovato every day   See your pcp for hands pain   Viral load testing today Follow up with me in 3 months

## 2020-10-06 LAB — HIV-1 RNA QUANT-NO REFLEX-BLD
HIV 1 RNA Quant: NOT DETECTED Copies/mL
HIV-1 RNA Quant, Log: NOT DETECTED Log cps/mL

## 2020-10-09 ENCOUNTER — Other Ambulatory Visit: Payer: Self-pay

## 2020-10-17 ENCOUNTER — Other Ambulatory Visit: Payer: Self-pay

## 2020-11-11 ENCOUNTER — Other Ambulatory Visit: Payer: Self-pay

## 2020-11-12 ENCOUNTER — Other Ambulatory Visit: Payer: Self-pay

## 2020-11-13 ENCOUNTER — Other Ambulatory Visit: Payer: Self-pay | Admitting: Family

## 2020-11-13 DIAGNOSIS — B20 Human immunodeficiency virus [HIV] disease: Secondary | ICD-10-CM

## 2020-11-27 ENCOUNTER — Ambulatory Visit: Payer: Self-pay

## 2020-11-27 ENCOUNTER — Other Ambulatory Visit: Payer: Self-pay

## 2020-12-19 ENCOUNTER — Other Ambulatory Visit: Payer: Self-pay

## 2020-12-19 DIAGNOSIS — B20 Human immunodeficiency virus [HIV] disease: Secondary | ICD-10-CM

## 2020-12-19 DIAGNOSIS — A150 Tuberculosis of lung: Secondary | ICD-10-CM

## 2020-12-21 LAB — CBC
HCT: 37.1 % — ABNORMAL LOW (ref 38.5–50.0)
Hemoglobin: 12.3 g/dL — ABNORMAL LOW (ref 13.2–17.1)
MCH: 32.3 pg (ref 27.0–33.0)
MCHC: 33.2 g/dL (ref 32.0–36.0)
MCV: 97.4 fL (ref 80.0–100.0)
MPV: 11.5 fL (ref 7.5–12.5)
Platelets: 148 10*3/uL (ref 140–400)
RBC: 3.81 10*6/uL — ABNORMAL LOW (ref 4.20–5.80)
RDW: 14.3 % (ref 11.0–15.0)
WBC: 4.5 10*3/uL (ref 3.8–10.8)

## 2020-12-21 LAB — COMPREHENSIVE METABOLIC PANEL
AG Ratio: 0.9 (calc) — ABNORMAL LOW (ref 1.0–2.5)
ALT: 24 U/L (ref 9–46)
AST: 29 U/L (ref 10–35)
Albumin: 3.7 g/dL (ref 3.6–5.1)
Alkaline phosphatase (APISO): 176 U/L — ABNORMAL HIGH (ref 35–144)
BUN/Creatinine Ratio: 12 (calc) (ref 6–22)
BUN: 18 mg/dL (ref 7–25)
CO2: 25 mmol/L (ref 20–32)
Calcium: 9.1 mg/dL (ref 8.6–10.3)
Chloride: 103 mmol/L (ref 98–110)
Creat: 1.47 mg/dL — ABNORMAL HIGH (ref 0.70–1.30)
Globulin: 4.3 g/dL (calc) — ABNORMAL HIGH (ref 1.9–3.7)
Glucose, Bld: 139 mg/dL — ABNORMAL HIGH (ref 65–99)
Potassium: 3.9 mmol/L (ref 3.5–5.3)
Sodium: 137 mmol/L (ref 135–146)
Total Bilirubin: 0.3 mg/dL (ref 0.2–1.2)
Total Protein: 8 g/dL (ref 6.1–8.1)

## 2020-12-21 LAB — HIV-1 RNA QUANT-NO REFLEX-BLD
HIV 1 RNA Quant: 52 Copies/mL — ABNORMAL HIGH
HIV-1 RNA Quant, Log: 1.72 Log cps/mL — ABNORMAL HIGH

## 2021-01-03 ENCOUNTER — Other Ambulatory Visit: Payer: Self-pay

## 2021-01-03 ENCOUNTER — Ambulatory Visit (INDEPENDENT_AMBULATORY_CARE_PROVIDER_SITE_OTHER): Payer: Self-pay | Admitting: Internal Medicine

## 2021-01-03 ENCOUNTER — Encounter: Payer: Self-pay | Admitting: Internal Medicine

## 2021-01-03 VITALS — BP 129/70 | HR 69 | Temp 98.1°F | Wt 135.0 lb

## 2021-01-03 DIAGNOSIS — Z113 Encounter for screening for infections with a predominantly sexual mode of transmission: Secondary | ICD-10-CM

## 2021-01-03 DIAGNOSIS — Z1159 Encounter for screening for other viral diseases: Secondary | ICD-10-CM

## 2021-01-03 DIAGNOSIS — B20 Human immunodeficiency virus [HIV] disease: Secondary | ICD-10-CM

## 2021-01-03 DIAGNOSIS — Z9189 Other specified personal risk factors, not elsewhere classified: Secondary | ICD-10-CM

## 2021-01-03 MED ORDER — SULFAMETHOXAZOLE-TRIMETHOPRIM 800-160 MG PO TABS
1.0000 | ORAL_TABLET | ORAL | 2 refills | Status: DC
Start: 2021-01-03 — End: 2021-09-03

## 2021-01-03 NOTE — Progress Notes (Signed)
4

## 2021-01-03 NOTE — Progress Notes (Signed)
Chief complaint: f/u pulm TB and hiv care  Subjective:    Patient ID: Manuel Hayes, male    DOB: 31-Oct-1970, 50 y.o.   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 He was changed from rifampin to rifabutin for preparation of starting ART.  -he doesn't know how he got hiv. No hx ivdu. Heterosexual. Prior married in [place]. 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 He has labs monitored along with recent sputum  He still has some dry mouth. He denies headache, visual blurriness, eye pain, visual loss  He is eating well and gaining weight  No n/v/rash, joint pain, diarrhea.   dovato started 10/14  meds reviewed: Dovato His home dm2 meds TB meds   04/02/20 id f/u Doing well, gaining weight (110 --> 130 pounds) Feeling well, no complaint Cough much improved Not seeing his PCP at [facility] clinic as afraid he can't afford medications. Off and on lovastatin, metformin, glipizide, and lisinopril and levothyroxine Taking dovato; not taking bactrim Still getting DOT for tb meds. Finished with intensive therapy 2 months on 10/30. He is on b6, rifabutin, and inh. No side effect   12/16 id f/u HIV no missed dose pulm tb -- on DOT. No concern. He doesn't know when his last sputum testing was Patient still haven't seen his pcp at [facility] clinic (financial) He brings his pill box today dovato Bactrim Nystatin  liquid Lisinopril Metformin Valacyclovir (he is supposed to be on synthroid, lovastatin, and glipizide as well); last sugar check was very high He in the mean time developed: 1) right eye pain/redness 3 days; no f/c or decreased vision. He has an eye doctor next visit 05/2020. He is on tb treatment on continuation phase now. His last cxr 03/2020 showed scarring left lower lobe. No f/c/nightsweat/cough/weight loss. Previously has some blurry vision but initial ophthalmologic visit no concern for infection; serologic w/u syphilis/toxo/cmv negative. No penile rash/discharge 2) recently seen by urgent care for right thigh shingles given valacyclovir; has 1 more pill to take. Lesions had scabbed over; pain much better  Labs to be done today   05/31/2020 id f/u He was referred and finally saw internal medicine clinic 1/07 at [facility] for primary care He returns today for tb/hiv care [place] had resolved Never saw ophthalmology (awaiting financial aid), however eye redness/discomfort resolved No f/c/n/v/diarrhea No cough Reviewed labs. Platelet has been at just above 50 Getting DOT for tb tx Compliant no missing dose of dovato  07/24/20 id f/u Complain of bilateral hand pain in the ip joints for about 1-2 month and also ankle/toes. These are most pronounced in the heels and the fingers. No wrist, knees, elbows or back pain. Some shoulder pain No skin rash No fever, chill Pain stable, 8/10. Constant daily.  No swelling Sx improves as the day goes along. Sx worst in the morning, and after prolonged rest including stiffness No family hx rheumatoid arthritis or lupus or autoimmune  disease No fatigue, oral/genital ulcer, new visual changes/blurriness/pain No dysuria/penile discharge   08/23/20 id f/u Patient is back here today for ongoing joint pain (wrist/hands, hip, knees/heels, elbows) Finished TB treatment already on 4/06 No fever, chill Good/normal energy level No  depression/insomnia/anxiety We review labs from last visit --> crp, lft normal; wbc normal; platelet at 100s He is on bid tivicay and daily descovy right now due to tb meds    10/02/20 Doing well Bilateral hands pain chronic No f/c/n/v/diarrhea Takes dovato everyday no missed dose  Hx via language interpreter   01/03/2021 Hx via language interpreter Patient without complaint No f/c/nightsweat/weight loss/decreased appetite His joint pain hurts a lot less now in hands and left shoulder. It hurts more at night not particularly at rest.  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  Family History  Problem Relation Age of Onset   Diabetes Mother    Diabetes Brother       Social History   Socioeconomic History   Marital status: Single    Spouse name: Not on file   Number of children: Not on file   Years of education: Not on file   Highest education level: Not on file  Occupational History   Not on file  Tobacco Use   Smoking status: Never   Smokeless tobacco: Never  Substance and Sexual Activity   Alcohol use: Not Currently   Drug use: Never   Sexual activity: Yes    Partners: Female    Comment: accepted condoms  Other Topics Concern   Not on file  Social History Narrative   Not on file   Social Determinants of Health   Financial Resource Strain: Not on file  Food Insecurity: Not on file  Transportation Needs: Not on file  Physical Activity: Not on file  Stress: Not on file   Social Connections: Not on file    No Known Allergies   Current Outpatient Medications:    Alcohol Swabs PADS, Clean fingers/equipments prior to CBG testing of your fingers, Disp: 100 each, Rfl: 11   atorvastatin (LIPITOR) 10 MG tablet, Take 1 tablet (10 mg total) by mouth daily., Disp: 90 tablet, Rfl: 3   blood glucose meter kit and supplies KIT, Dispense based on patient and insurance preference. Use up to four times daily as directed. (FOR ICD-9 250.00, 250.01)., Disp: 1 each, Rfl: 0   Canagliflozin-metFORMIN HCl ER (INVOKAMET XR) 50-1000 MG TB24, TAKE 1 TABLET BY MOUTH DAILY (Patient taking differently: TAKE 1 TABLET BY MOUTH DAILY), Disp: 90 tablet, Rfl: 1   Dolutegravir-lamiVUDine (DOVATO) 50-300 MG TABS, Take 1 tablet by mouth daily., Disp: 30 tablet, Rfl: 11   glucose blood test strip, USE 1 TO CHECK BLOOD GLUCOSE UP TO 7 TIMES A WEEK. (USELO PARA CONTROLAR EL NIVEL DE AZUCAR EN LA SANGRE HASTA 7 VECES A LA [place]). (Patient taking differently: USE 1 TO CHECK BLOOD GLUCOSE UP  TO 7 TIMES A WEEK. (USELO PARA CONTROLAR EL NIVEL DE AZUCAR EN LA SANGRE HASTA 7 VECES A LA [place]).), Disp: 100 strip, Rfl: 3   hydrocortisone (ANUSOL-HC) 2.5 % rectal cream, Proctosol HC 2.5 % topical cream perineal applicator  APPLY TO RECTUM 2 3 TIMES DAILY, Disp: , Rfl:    lisinopril (ZESTRIL) 10 MG tablet, Take 1 tablet (10 mg total) by mouth daily., Disp: 90 tablet, Rfl: 3   Microlet Lancets MISC, USE 1 UP TO 7 TIMES A WEEK. (USELO PARA CONTROLAR EL NIVEL DE AZUCAR EN LA SANGRE SEGUN LAS INSTRUCCIONES HASTA 7 VECES A LA [place]., Disp: 100 each, Rfl: 3   Omega-3 Fatty Acids (FISH OIL PO), Take by mouth., Disp: , Rfl:    gabapentin (NEURONTIN) 100 MG capsule, Take 1 capsule (100 mg total) by mouth 3 (three) times daily. (Patient not taking: No sig reported), Disp: 90 capsule, Rfl: 2   levothyroxine (SYNTHROID) 25 MCG tablet, Take 1 tablet (25 mcg total) by mouth  daily before breakfast., Disp: 30 tablet, Rfl: 11   sulfamethoxazole-trimethoprim (BACTRIM DS) 800-160 MG tablet, Take 1 tablet by mouth daily., Disp: 90 tablet, Rfl: 1      Objective:    Vitals reviewed General/constitutional: no distress, pleasant HEENT: Normocephalic, PER, Conj Clear, EOMI, Oropharynx clear Neck supple CV: rrr no mrg Lungs: clear to auscultation, normal respiratory effort Abd: Soft, Nontender Ext: no edema Skin: No Rash Neuro: nonfocal MSK: no peripheral joint swelling/tenderness/warmth; back spines nontender   Central line presence: no      Labs: Lab Results  Component Value Date   WBC 4.5 12/19/2020   HGB 12.3 (L) 12/19/2020   HCT 37.1 (L) 12/19/2020   MCV 97.4 12/19/2020   PLT 148 37/02/6268   Last metabolic panel Lab Results  Component Value Date   GLUCOSE 139 (H) 12/19/2020   NA 137 12/19/2020   K 3.9 12/19/2020   CL 103 12/19/2020   CO2 25 12/19/2020   BUN 18 12/19/2020   CREATININE 1.47 (H) 12/19/2020   GFRNONAA 63 07/24/2020   GFRAA 73 07/24/2020   CALCIUM 9.1 12/19/2020   PROT 8.0 12/19/2020   ALBUMIN 2.2 (L) 11/20/2019   BILITOT 0.3 12/19/2020   ALKPHOS 331 (H) 11/20/2019   AST 29 12/19/2020   ALT 24 12/19/2020   ANIONGAP 6 11/20/2019    3/09 cr 1.3; lft normal; cbc 4/13/103; tsh 4.43; a1c 6.3; ana negative; rf/ccp negative 3/09 lipid 180; 36; 103; 310  Serology: 01/2020 toxoplasma IgG positive; hep b sAg/sAb/cAb NR; hep C ab negative; Hep A ab reactive; cmv IgM and hsv1&2 IgM negative; vzv igg positive 01/2020 urine gc/chlam pcr negative 01/2020 rpr negative  hiv labs        cd4 (%)  /  VL 9/23              50 (3%) / 197k 11/16                          / 373 12/16           166 (6)    /  66 05/31/20                     /  25 07/2020        129 (8)   /  <20 04/29                          /  56 05/18                          /  undetectable 12/19/20                     /  50s   Imaging: 11/16 chest xray left lung  scarring  08/21/20 chest xray Redemonstration of nodularity of the left lung base, unchanged from prior chest x-ray and CT, with no evidence of acute cardiopulmonary disease      Assessment & Plan:   Patient Active Problem List   Diagnosis Date Noted   Need for tetanus, diphtheria, and acellular pertussis (Tdap) vaccine 09/06/2020   Hypertension associated with diabetes ([place]) 09/06/2020   Acquired hypothyroidism 09/06/2020   Herpes zoster 04/22/2020   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 Finished intensive tx 10/29.  AFB sputum surveillance ngative on 9/5, 6, 19, 20, 10/3, 4, 17, 18. Spoke with RN Ishmael Holter from [facility] Off ethambutol 9/16 as TB was pan-sensitive 03/2020 cxr residual scarring LLL, no cavity Developed thrombocytopenia into 50s, thought to be related to rifampin, but improved  ----------- Finished tb meds 4/6. Platelet recovered while on tb meds   #AIDS/HIV disease: started on dovato 02/29/2020. No prior therapy (new dx and referred to our clinic). No evidence Hepatitis B. Genotype 02/08/2020 no RTI or PI resistance (mutations -- RT a98S, v118I, R211k; PR I13v, d60e, l63p, v77i). He is doing very well on Dovato, and very compliant. Viral load undetectable 07/2020  Patient reports ongoing compliance, but there are blips here and there.  He also has stopped his bactrim again as of 01/03/21 visit Discussed need for compliance and tiw bactrim for pjp  prophylaxis until I advise him to stop   -continue dovato -restart bactrim ds one tablet tiw until I ask him to stop -discuss U=U -encourage continued compliance -do labs 07/2021 and see me a week after   #hands pain #left shoulder pain Previously was on tb meds and sx were very bad Much improved as of 01/03/21 visit Suspect current reason due to djd. No feature of inflammatory arthritis  -prn tylenol -f/u pcp     #HCM -will need discuss hepatitis B vaccination among others when his cd4 count >200 -no cancer screening needed at this time   I have spent a total of 20 minutes of face-to-face and non-face-to-face time, excluding clinical staff time, preparing to see patient, ordering tests and/or medications, and provide counseling the patient

## 2021-01-03 NOTE — Patient Instructions (Signed)
You are doing well  Make sure you don't miss any dose of your HIV medication dovato  And also restart bactrim --> take 1 tablet 3 times a week to prevent pneumonia. Do not stop until I advise so. I have sent it to the mail in pharmacy. If you haven't received in 1-2 weeks call our clinic    Do blood tests at the end of 06/2021 and schedule clinic visit with me a week after

## 2021-01-06 ENCOUNTER — Other Ambulatory Visit: Payer: Self-pay

## 2021-01-06 ENCOUNTER — Encounter: Payer: Self-pay | Admitting: Internal Medicine

## 2021-01-06 ENCOUNTER — Ambulatory Visit: Payer: Self-pay | Attending: Internal Medicine | Admitting: Internal Medicine

## 2021-01-06 DIAGNOSIS — E1169 Type 2 diabetes mellitus with other specified complication: Secondary | ICD-10-CM

## 2021-01-06 DIAGNOSIS — E039 Hypothyroidism, unspecified: Secondary | ICD-10-CM

## 2021-01-06 DIAGNOSIS — E1159 Type 2 diabetes mellitus with other circulatory complications: Secondary | ICD-10-CM

## 2021-01-06 DIAGNOSIS — E785 Hyperlipidemia, unspecified: Secondary | ICD-10-CM

## 2021-01-06 DIAGNOSIS — E1121 Type 2 diabetes mellitus with diabetic nephropathy: Secondary | ICD-10-CM

## 2021-01-06 DIAGNOSIS — I152 Hypertension secondary to endocrine disorders: Secondary | ICD-10-CM

## 2021-01-06 DIAGNOSIS — E1165 Type 2 diabetes mellitus with hyperglycemia: Secondary | ICD-10-CM

## 2021-01-06 MED ORDER — INVOKAMET XR 50-1000 MG PO TB24
ORAL_TABLET | ORAL | 3 refills | Status: DC
  Filled 2021-01-06: qty 90, fill #0
  Filled 2021-01-13: qty 30, 30d supply, fill #0
  Filled 2021-03-03: qty 30, 30d supply, fill #1
  Filled 2021-04-21: qty 30, 30d supply, fill #2
  Filled 2021-05-28: qty 30, 30d supply, fill #0
  Filled 2021-06-30: qty 30, 30d supply, fill #1
  Filled 2021-07-29: qty 30, 30d supply, fill #2
  Filled 2021-09-09: qty 30, 30d supply, fill #3
  Filled 2021-10-02 – 2022-01-05 (×5): qty 30, 30d supply, fill #4

## 2021-01-06 NOTE — Progress Notes (Signed)
Patient ID: Manuel Hayes, male   DOB: April 19, 1971, 50 y.o.   MRN: [id number] Virtual Visit via Telephone Note  I connected with Manuel Hayes on 01/06/2021 at 11:05 AM by telephone and verified that I am speaking with the correct person using two identifiers  Location: Patient: home Provider: office  Participants: Myself Patient Manuel Hayes interpreter: [phone], Manuel Hayes   I discussed the limitations, risks, security and privacy concerns of performing an evaluation and management service by telephone and the availability of in person appointments. I also discussed with the patient that there may be a patient responsible charge related to this service. The patient expressed understanding and agreed to proceed.   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  Last seen 08/2020.   DM:   Lab Results  Component Value Date   HGBA1C 6.3 (H) 07/24/2020  checks BS several times a wk but not every day.  Range 115-150 -Taking Invokamet 50/1000 mg once a day.  It has fallen off his med list but pt confirms he is still taking it -does well with eating habits and exercises several times a wk  HYPERTENSION Currently taking: see medication list.  On Lisinopril 10 mg daily. Had 2.7 gram of protein in urine checked on last visit Med Adherence: [x]  Yes    []  No Medication side effects: []  Yes    [x]  No Adherence with salt restriction: [x]  Yes    []  No Home Monitoring?: []  Yes    [x]  No - no device to check Monitoring Frequency:  Home BP results range:  SOB? []  Yes    [x]  No Chest Pain?: []  Yes    [x]  No Leg swelling?: []  Yes    [x]  No Headaches?: []  Yes    [x]  No Dizziness? []  Yes    [x]  No Comments:   HL:  taking and tolerating Lipitor.  LDL on last visit was 103.  Hypothyroid: off Levothyroxine since 01/2020.  TSH on last visit mildly elev at 7.  Denies feeling cold all the time. No  major wgh changes.    Outpatient Encounter Medications as of 01/06/2021  Medication Sig   Alcohol Swabs PADS Clean fingers/equipments prior to CBG testing of your fingers   atorvastatin (LIPITOR) 10 MG tablet Take 1 tablet (10 mg total) by mouth daily.   blood glucose meter kit and supplies KIT Dispense based on patient and insurance preference. Use up to four times daily as directed. (FOR ICD-9 250.00, 250.01).   Canagliflozin-metFORMIN HCl ER (INVOKAMET XR) 50-1000 MG TB24 TAKE 1 TABLET BY MOUTH DAILY (Patient taking differently: TAKE 1 TABLET BY MOUTH DAILY)   Dolutegravir-lamiVUDine (DOVATO) 50-300 MG TABS Take 1 tablet by mouth daily.   gabapentin (NEURONTIN) 100 MG capsule Take 1 capsule (100 mg total) by mouth 3 (three) times daily. (Patient not taking: No sig reported)   glucose blood test strip USE 1 TO CHECK BLOOD GLUCOSE UP TO 7 TIMES A WEEK. (USELO PARA CONTROLAR EL NIVEL DE AZUCAR EN LA SANGRE HASTA 7 VECES A LA [place]). (Patient taking differently: USE 1 TO CHECK BLOOD GLUCOSE UP TO 7 TIMES A WEEK. (USELO PARA CONTROLAR EL NIVEL DE AZUCAR EN LA SANGRE HASTA 7 VECES A LA [place]).)   hydrocortisone (ANUSOL-HC) 2.5 % rectal cream Proctosol HC 2.5 % topical cream perineal applicator  APPLY TO RECTUM 2 3 TIMES DAILY   levothyroxine (  SYNTHROID) 25 MCG tablet Take 1 tablet (25 mcg total) by mouth daily before breakfast.   lisinopril (ZESTRIL) 10 MG tablet Take 1 tablet (10 mg total) by mouth daily.   Microlet Lancets MISC USE 1 UP TO 7 TIMES A WEEK. (USELO PARA CONTROLAR EL NIVEL DE AZUCAR EN LA SANGRE SEGUN LAS INSTRUCCIONES HASTA 7 VECES A LA [place].   Omega-3 Fatty Acids (FISH OIL PO) Take by mouth.   sulfamethoxazole-trimethoprim (BACTRIM DS) 800-160 MG tablet Take 1 tablet by mouth 3 (three) times a week.   No facility-administered encounter medications on file as of 01/06/2021.      Observations/Objective: Results for orders placed  or performed in visit on 12/19/20  Comprehensive metabolic panel  Result Value Ref Range   Glucose, Bld 139 (H) 65 - 99 mg/dL   BUN 18 7 - 25 mg/dL   Creat 1.47 (H) 0.70 - 1.30 mg/dL   BUN/Creatinine Ratio 12 6 - 22 (calc)   Sodium 137 135 - 146 mmol/L   Potassium 3.9 3.5 - 5.3 mmol/L   Chloride 103 98 - 110 mmol/L   CO2 25 20 - 32 mmol/L   Calcium 9.1 8.6 - 10.3 mg/dL   Total Protein 8.0 6.1 - 8.1 g/dL   Albumin 3.7 3.6 - 5.1 g/dL   Globulin 4.3 (H) 1.9 - 3.7 g/dL (calc)   AG Ratio 0.9 (L) 1.0 - 2.5 (calc)   Total Bilirubin 0.3 0.2 - 1.2 mg/dL   Alkaline phosphatase (APISO) 176 (H) 35 - 144 U/L   AST 29 10 - 35 U/L   ALT 24 9 - 46 U/L  CBC  Result Value Ref Range   WBC 4.5 3.8 - 10.8 Thousand/uL   RBC 3.81 (L) 4.20 - 5.80 Million/uL   Hemoglobin 12.3 (L) 13.2 - 17.1 g/dL   HCT 37.1 (L) 38.5 - 50.0 %   MCV 97.4 80.0 - 100.0 fL   MCH 32.3 27.0 - 33.0 pg   MCHC 33.2 32.0 - 36.0 g/dL   RDW 14.3 11.0 - 15.0 %   Platelets 148 140 - 400 Thousand/uL   MPV 11.5 7.5 - 12.5 fL  HIV-1 RNA quant-no reflex-bld  Result Value Ref Range   HIV 1 RNA Quant 52 (H) Copies/mL   HIV-1 RNA Quant, Log 1.72 (H) Log cps/mL   Lab Results  Component Value Date   TSH 7.710 (H) 09/06/2020   Lab Results  Component Value Date   CHOL 180 07/24/2020   HDL 36 (L) 07/24/2020   LDLCALC 103 (H) 07/24/2020   TRIG 310 (H) 07/24/2020   CHOLHDL 5.0 (H) 07/24/2020     Assessment and Plan: 1. 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  Continue Invokamet, healthy eating habits and regular exercise.  Encouraged him to get an eye exam done when he is able to afford. -He will come to the lab to have A1c done and repeat microalbumin to see whether the amount of protein in the urine has decreased with him having been on the lisinopril for several months now. - Microalbumin / creatinine urine ratio; Future - Hemoglobin A1c; Future  2.  Hypertension associated with diabetes ([place]) Recent blood pressure reading in the system is at goal.  Continue lisinopril  3. Hyperlipidemia associated with type 2 diabetes mellitus (HCC) Continue atorvastatin.  Recheck lipid profile to see whether LDL is at goal given he has been on the atorvastatin for several months - Lipid panel;  Future  4. Acquired hypothyroidism - TSH+T4F+T3Free; Future   Follow Up Instructions: 4 mths   I discussed the assessment and treatment plan with the patient. The patient was provided an opportunity to ask questions and all were answered. The patient agreed with the plan and demonstrated an understanding of the instructions.   The patient was advised to call back or seek an in-person evaluation if the symptoms worsen or if the condition fails to improve as anticipated.  I  Spent 17 minutes on this telephone encounter  Karle Plumber, MD

## 2021-01-13 ENCOUNTER — Other Ambulatory Visit: Payer: Self-pay

## 2021-01-13 NOTE — Addendum Note (Signed)
Addended byMemory Dance on: 01/13/2021 08:57 AM   Modules accepted: Orders

## 2021-01-14 ENCOUNTER — Other Ambulatory Visit: Payer: Self-pay

## 2021-01-14 LAB — MICROALBUMIN / CREATININE URINE RATIO
Creatinine, Urine: 89.4 mg/dL
Microalb/Creat Ratio: 253 mg/g creat — ABNORMAL HIGH (ref 0–29)
Microalbumin, Urine: 226.5 ug/mL

## 2021-01-14 LAB — TSH+T4F+T3FREE
Free T4: 1.23 ng/dL (ref 0.82–1.77)
T3, Free: 3.3 pg/mL (ref 2.0–4.4)
TSH: 5.16 u[IU]/mL — ABNORMAL HIGH (ref 0.450–4.500)

## 2021-01-14 LAB — LIPID PANEL
Chol/HDL Ratio: 3.8 ratio (ref 0.0–5.0)
Cholesterol, Total: 144 mg/dL (ref 100–199)
HDL: 38 mg/dL — ABNORMAL LOW (ref >39)
LDL Chol Calc (NIH): 75 mg/dL (ref 0–99)
Triglycerides: 186 mg/dL — ABNORMAL HIGH (ref 0–149)
VLDL Cholesterol Cal: 31 mg/dL (ref 5–40)

## 2021-01-14 LAB — HEMOGLOBIN A1C
Est. average glucose Bld gHb Est-mCnc: 163 mg/dL
Hgb A1c MFr Bld: 7.3 % — ABNORMAL HIGH (ref 4.8–5.6)

## 2021-01-15 NOTE — Progress Notes (Signed)
Let patient know that his cholesterol level is good.  Continue atorvastatin. A1c is 7.3 with goal being less than 7.  This means his diabetes is not as controlled as it should be.  Continue Invokamet and try to improve on healthy eating habits and regular exercise.  He still has some protein in the urine but it has significantly decreased since being on the lisinopril.  Continue lisinopril.  Thyroid level slightly abnormal but improved from when it was last checked 4 months ago.  We will plan to recheck again on next visit.

## 2021-01-17 ENCOUNTER — Other Ambulatory Visit: Payer: Self-pay

## 2021-01-22 ENCOUNTER — Telehealth: Payer: Self-pay

## 2021-01-22 NOTE — Telephone Encounter (Signed)
Pt has been scheduled and reminder has been mailed.  

## 2021-01-22 NOTE — Telephone Encounter (Signed)
-----   Message from Marcine Matar, MD sent at 01/06/2021 12:41 PM EDT ----- F/u in 4 mths

## 2021-01-24 ENCOUNTER — Telehealth: Payer: Self-pay | Admitting: Internal Medicine

## 2021-01-24 NOTE — Telephone Encounter (Signed)
Pt is calling back for lab results with the assistance of [employer]. [phone]

## 2021-01-24 NOTE — Telephone Encounter (Signed)
Results given, see result notes. 

## 2021-01-27 ENCOUNTER — Other Ambulatory Visit: Payer: Self-pay

## 2021-03-03 ENCOUNTER — Other Ambulatory Visit: Payer: Self-pay

## 2021-04-21 ENCOUNTER — Other Ambulatory Visit: Payer: Self-pay

## 2021-04-22 ENCOUNTER — Other Ambulatory Visit: Payer: Self-pay

## 2021-04-24 ENCOUNTER — Other Ambulatory Visit: Payer: Self-pay

## 2021-05-28 ENCOUNTER — Ambulatory Visit: Payer: Self-pay

## 2021-05-28 ENCOUNTER — Other Ambulatory Visit: Payer: Self-pay

## 2021-05-29 ENCOUNTER — Encounter: Payer: Self-pay | Admitting: Internal Medicine

## 2021-05-29 ENCOUNTER — Other Ambulatory Visit: Payer: Self-pay

## 2021-05-29 ENCOUNTER — Ambulatory Visit: Payer: Self-pay | Attending: Internal Medicine | Admitting: Internal Medicine

## 2021-05-29 DIAGNOSIS — E1121 Type 2 diabetes mellitus with diabetic nephropathy: Secondary | ICD-10-CM

## 2021-05-29 DIAGNOSIS — E1159 Type 2 diabetes mellitus with other circulatory complications: Secondary | ICD-10-CM

## 2021-05-29 DIAGNOSIS — E785 Hyperlipidemia, unspecified: Secondary | ICD-10-CM

## 2021-05-29 DIAGNOSIS — Z23 Encounter for immunization: Secondary | ICD-10-CM

## 2021-05-29 DIAGNOSIS — I152 Hypertension secondary to endocrine disorders: Secondary | ICD-10-CM

## 2021-05-29 DIAGNOSIS — E1169 Type 2 diabetes mellitus with other specified complication: Secondary | ICD-10-CM

## 2021-05-29 DIAGNOSIS — E039 Hypothyroidism, unspecified: Secondary | ICD-10-CM

## 2021-05-29 NOTE — Progress Notes (Signed)
Patient ID: Manuel Hayes, male   DOB: [birth year], 51 y.o.   MRN: [id number] Virtual Visit via Telephone Note  I connected with [name] on 05/29/2021 at 10:02 AM by telephone and verified that I am speaking with the correct person using two identifiers  Location: Patient: home Provider: office  Participants: Myself Patient Manuel Hayes interpreter: Luanna Salk [place] discussed the limitations, risks, security and privacy concerns of performing an evaluation and management service by telephone and the availability of in person appointments. I also discussed with the patient that there may be a patient responsible charge related to this service. The patient expressed understanding and agreed to proceed.   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  Last eval 12/2020.  Today's visit is for chronic disease management.  DIABETES TYPE 2 Last A1C:   Lab Results  Component Value Date   HGBA1C 7.3 (H) 01/13/2021   Med Adherence:  _0  Yes on  Invokamet 50/1000  daily  Medication side effects:  _1  Yes    _2  No Home Monitoring?  _3  Yes -several times a wk  Home glucose results range: 111-120 Diet Adherence: _4  Yes    _5  No -Works as a Curator.  Often gets fast food for lunch.  Sometimes he packs his lunch from home. Exercise: _6  Yes does stretching exercises in the mornings and walks on the days that he is off work.    Hypoglycemic episodes?: _7  Yes    _8  No Numbness of the feet? _9  Yes    _10  No Retinopathy hx? _11  Yes    _12  No Last eye exam: no blurred vision.  Over due for eye exam.  No insurance Comments:   HYPERTENSION Currently taking: see medication list Med Adherence: _13  Yes -Lisinopril   _14  No Medication side effects: _15  Yes    _16  No Adherence with salt restriction: _17  Yes    _18  No Home Monitoring?: _19  Yes    _20  No - no device to check Monitoring  Frequency:  Home BP results range:  SOB? _21  Yes    _22  No Chest Pain?: _23  Yes    _24  No Leg swelling?: _25  Yes    _26  No Headaches?: _27  Yes    _28  No Dizziness? _29  Yes    _30  No Comments:   HL:  reports compliance with Lipitor.  Tolerating med.  Last LDL was 75  Hypothyroid:  reports compliance with Levothyroxine.  Last TSH was 5.16  HM:  due for flu, Shingdrix and Tdapt vaccines Outpatient Encounter Medications as of 05/29/2021  Medication Sig   Alcohol Swabs PADS Clean fingers/equipments prior to CBG testing of your fingers   atorvastatin (LIPITOR) 10 MG tablet Take 1 tablet (10 mg total) by mouth daily.   blood glucose meter kit and supplies KIT Dispense based on patient and insurance preference. Use up to four times daily as directed. (FOR ICD-9 250.00, 250.01).   Canagliflozin-metFORMIN HCl ER (INVOKAMET XR) 50-1000 MG TB24 TAKE 1 TABLET BY MOUTH DAILY   Dolutegravir-lamiVUDine (DOVATO) 50-300 MG TABS Take 1 tablet by mouth daily.   gabapentin (NEURONTIN) 100 MG capsule Take 1 capsule (100 mg total) by mouth 3 (three) times daily. (Patient not taking: No sig reported)   glucose blood test strip USE 1 TO CHECK BLOOD GLUCOSE UP TO 7 TIMES A WEEK. (USELO PARA CONTROLAR EL NIVEL DE AZUCAR EN LA SANGRE HASTA 7 VECES  A LA SEMANA SEGUN LAS INSTRUCCIONES). (Patient taking differently: USE 1 TO CHECK BLOOD GLUCOSE UP TO 7 TIMES A WEEK. (USELO PARA CONTROLAR EL NIVEL DE AZUCAR EN LA SANGRE HASTA 7 VECES A LA [place]).)   hydrocortisone (ANUSOL-HC) 2.5 % rectal cream Proctosol HC 2.5 % topical cream perineal applicator  APPLY TO RECTUM 2 3 TIMES DAILY   levothyroxine (SYNTHROID) 25 MCG tablet Take 1 tablet (25 mcg total) by mouth daily before breakfast.   lisinopril (ZESTRIL) 10 MG tablet Take 1 tablet (10 mg total) by mouth daily.   Microlet Lancets MISC USE 1 UP TO 7 TIMES A WEEK. (USELO PARA CONTROLAR EL NIVEL DE AZUCAR EN LA SANGRE SEGUN LAS INSTRUCCIONES HASTA 7 VECES A  LA [place].   Omega-3 Fatty Acids (FISH OIL PO) Take by mouth.   No facility-administered encounter medications on file as of 05/29/2021.      Observations/Objective: No direct observation done as this was a telephone visit. Results for orders placed or performed in visit on 01/06/21  Lipid panel  Result Value Ref Range   Cholesterol, Total 144 100 - 199 mg/dL   Triglycerides 186 (H) 0 - 149 mg/dL   HDL 38 (L) >39 mg/dL   VLDL Cholesterol Cal 31 5 - 40 mg/dL   LDL Chol Calc (NIH) 75 0 - 99 mg/dL   Chol/HDL Ratio 3.8 0.0 - 5.0 ratio  Hemoglobin A1c  Result Value Ref Range   Hgb A1c MFr Bld 7.3 (H) 4.8 - 5.6 %   Est. average glucose Bld gHb Est-mCnc 163 mg/dL  Microalbumin / creatinine urine ratio  Result Value Ref Range   Creatinine, Urine 89.4 Not Estab. mg/dL   Microalbumin, Urine 226.5 Not Estab. ug/mL   Microalb/Creat Ratio 253 (H) 0 - 29 mg/g creat  TSH+T4F+T3Free  Result Value Ref Range   TSH 5.160 (H) 0.450 - 4.500 uIU/mL   T3, Free 3.3 2.0 - 4.4 pg/mL   Free T4 1.23 0.82 - 1.77 ng/dL     Assessment and Plan: 1. 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  Encouraged him to pack and take his lunch from home rather than eating fast food for lunch. Continue Invokamet -Continue lisinopril.  Protein in the urine had decreased from 2.7 g to 253 when last checked. Encouraged to get eye exam done - Hemoglobin A1c; Future  2. Hypertension associated with diabetes ([place]) Continue lisinopril.  3. Hyperlipidemia associated with type 2 diabetes mellitus (HCC) Continue atorvastatin.  4. Acquired hypothyroidism Continue levothyroxine. - TSH; Future  5. Need for tetanus, diphtheria, and acellular pertussis (Tdap) vaccine 6. Need for influenza vaccination We will have him scheduled to see the clinical pharmacist to get Tdap and flu vaccine.      Follow Up Instructions: 4 months. He will come to see the clinical pharmacist to get the Tdap and flu vaccines.   I discussed the assessment and treatment plan with the patient. The patient was provided an opportunity to ask questions and all were answered. The patient agreed with the plan and demonstrated an understanding of the instructions.   The patient was advised to call back or seek an in-person evaluation if the symptoms worsen or if the condition fails to improve as anticipated.  I  Spent 13 minutes on this telephone encounter  Karle Plumber, MD

## 2021-05-30 ENCOUNTER — Telehealth: Payer: Self-pay

## 2021-05-30 NOTE — Telephone Encounter (Signed)
Pt has been scheduled and reminder has been mailed.  

## 2021-06-06 ENCOUNTER — Other Ambulatory Visit: Payer: Self-pay

## 2021-06-06 ENCOUNTER — Ambulatory Visit: Payer: Self-pay | Attending: Internal Medicine

## 2021-06-06 DIAGNOSIS — E039 Hypothyroidism, unspecified: Secondary | ICD-10-CM

## 2021-06-06 DIAGNOSIS — E1121 Type 2 diabetes mellitus with diabetic nephropathy: Secondary | ICD-10-CM

## 2021-06-07 ENCOUNTER — Other Ambulatory Visit: Payer: Self-pay | Admitting: Internal Medicine

## 2021-06-07 DIAGNOSIS — E039 Hypothyroidism, unspecified: Secondary | ICD-10-CM

## 2021-06-07 LAB — HEMOGLOBIN A1C
Est. average glucose Bld gHb Est-mCnc: 160 mg/dL
Hgb A1c MFr Bld: 7.2 % — ABNORMAL HIGH (ref 4.8–5.6)

## 2021-06-07 LAB — TSH: TSH: 5.57 u[IU]/mL — ABNORMAL HIGH (ref 0.450–4.500)

## 2021-06-07 MED ORDER — LEVOTHYROXINE SODIUM 25 MCG PO TABS: 37.5000 ug | ORAL_TABLET | Freq: Every day | ORAL | 11 refills | Status: DC

## 2021-06-07 NOTE — Progress Notes (Signed)
Let patient know that his A1c is 7.2 with goal being less than 7.  Continue Invokamet, healthy eating habits and trying to exercise.  Thyroid level is off.  He told me on recent visit that he has been taking the levothyroxine consistently.  We will need to increase the dose from 25 mcg daily to 1 1/2 tablets daily.  I have sent an updated prescription to [facility] on [address].  After he has been on the increased dose for 4 to 6 weeks, he should return to the lab to have levels rechecked.

## 2021-06-11 ENCOUNTER — Telehealth: Payer: Self-pay

## 2021-06-11 NOTE — Telephone Encounter (Signed)
Interpreter:Juan [id number]  Contacted pt to go over lab results pt is aware and doesn't have any questions or concerns

## 2021-06-30 ENCOUNTER — Other Ambulatory Visit: Payer: Self-pay

## 2021-07-01 ENCOUNTER — Other Ambulatory Visit: Payer: Self-pay | Admitting: Internal Medicine

## 2021-07-01 ENCOUNTER — Other Ambulatory Visit: Payer: Self-pay

## 2021-07-01 DIAGNOSIS — B20 Human immunodeficiency virus [HIV] disease: Secondary | ICD-10-CM

## 2021-07-01 IMAGING — CR DG CHEST 2V
2 series · 2 of 2 positions shown · non-contrast
Comparison: 04/02/2020, CT 11/20/2019

CLINICAL DATA: 50-year-old male with completed TB treatment

EXAM:
CHEST - 2 VIEW

[w chest pa]
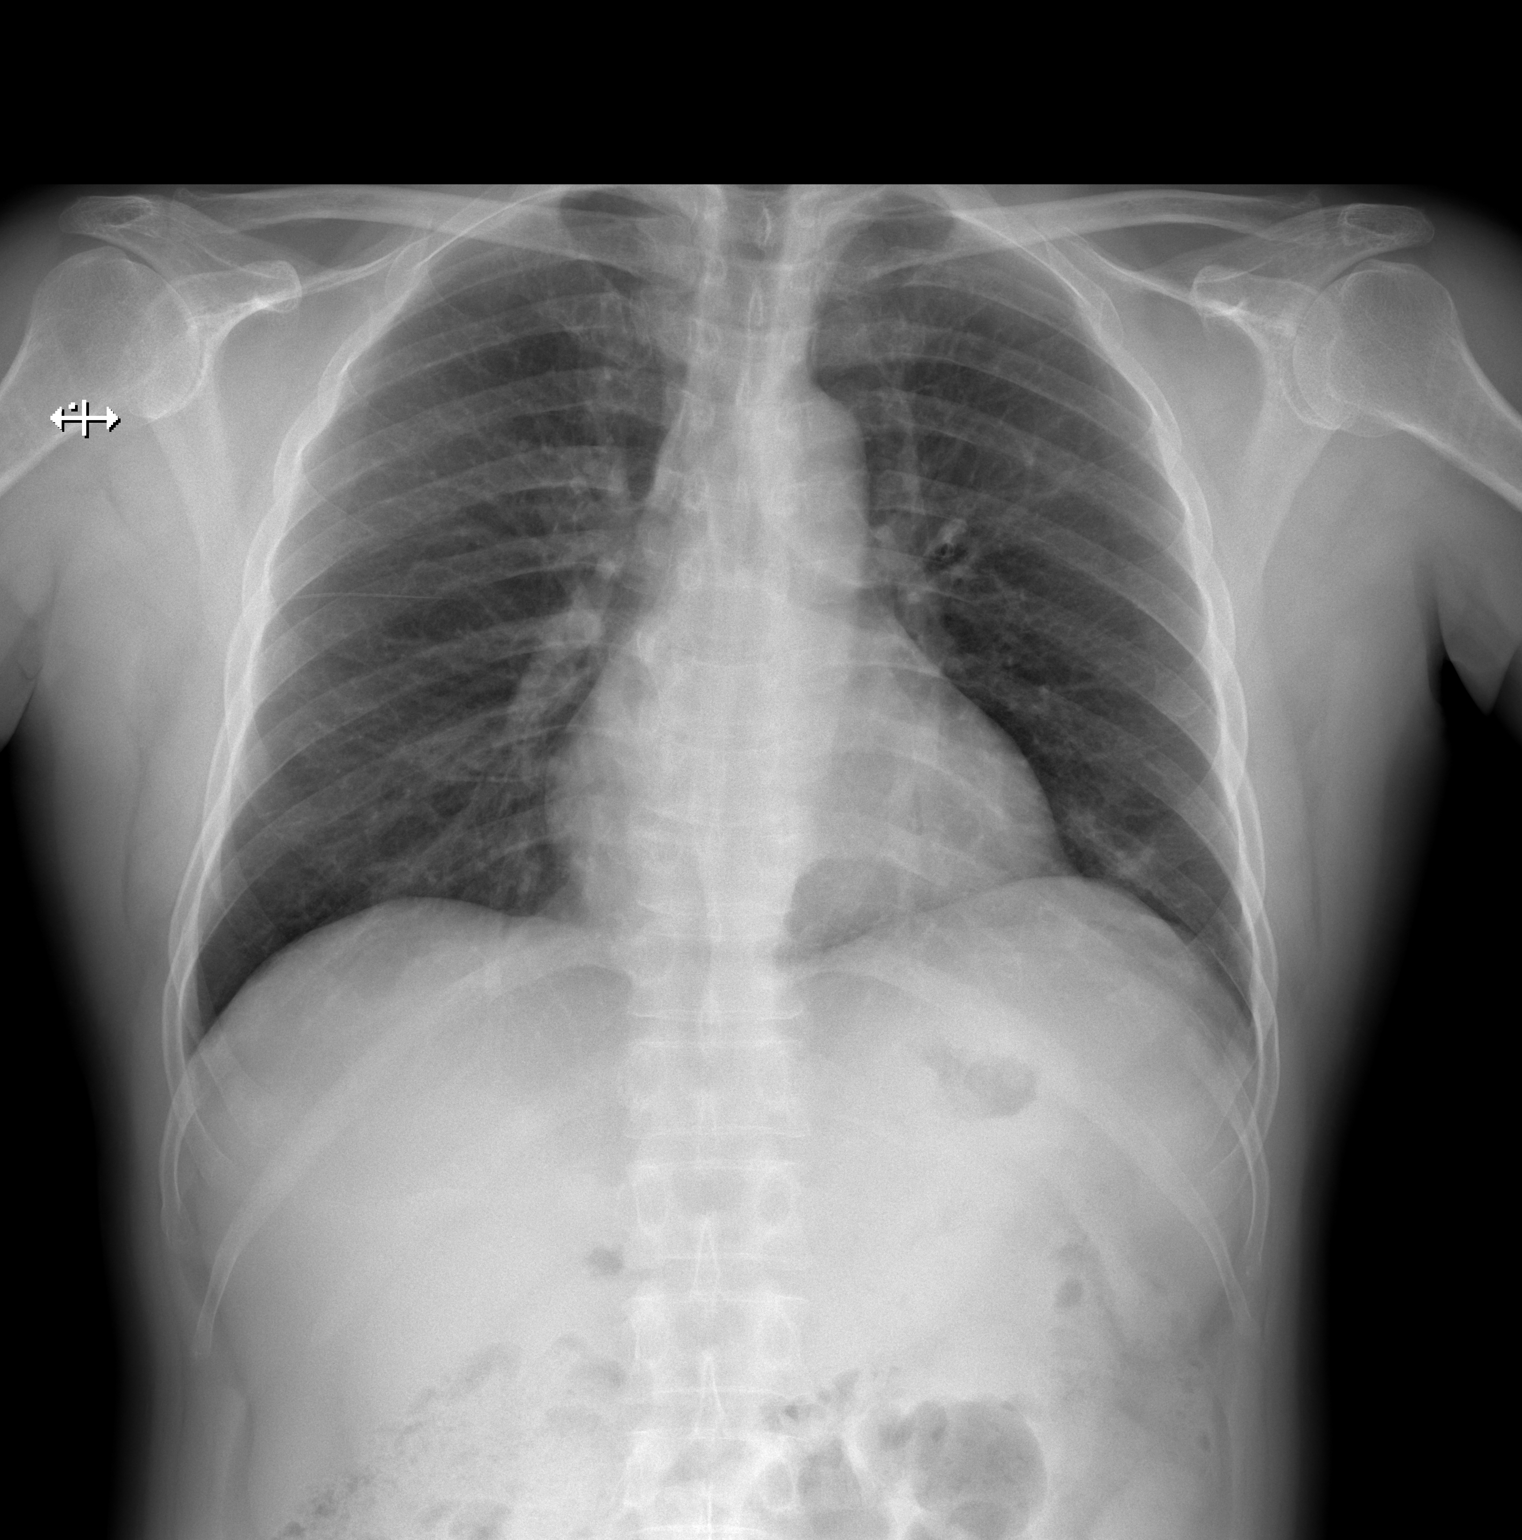

[w chest lat]
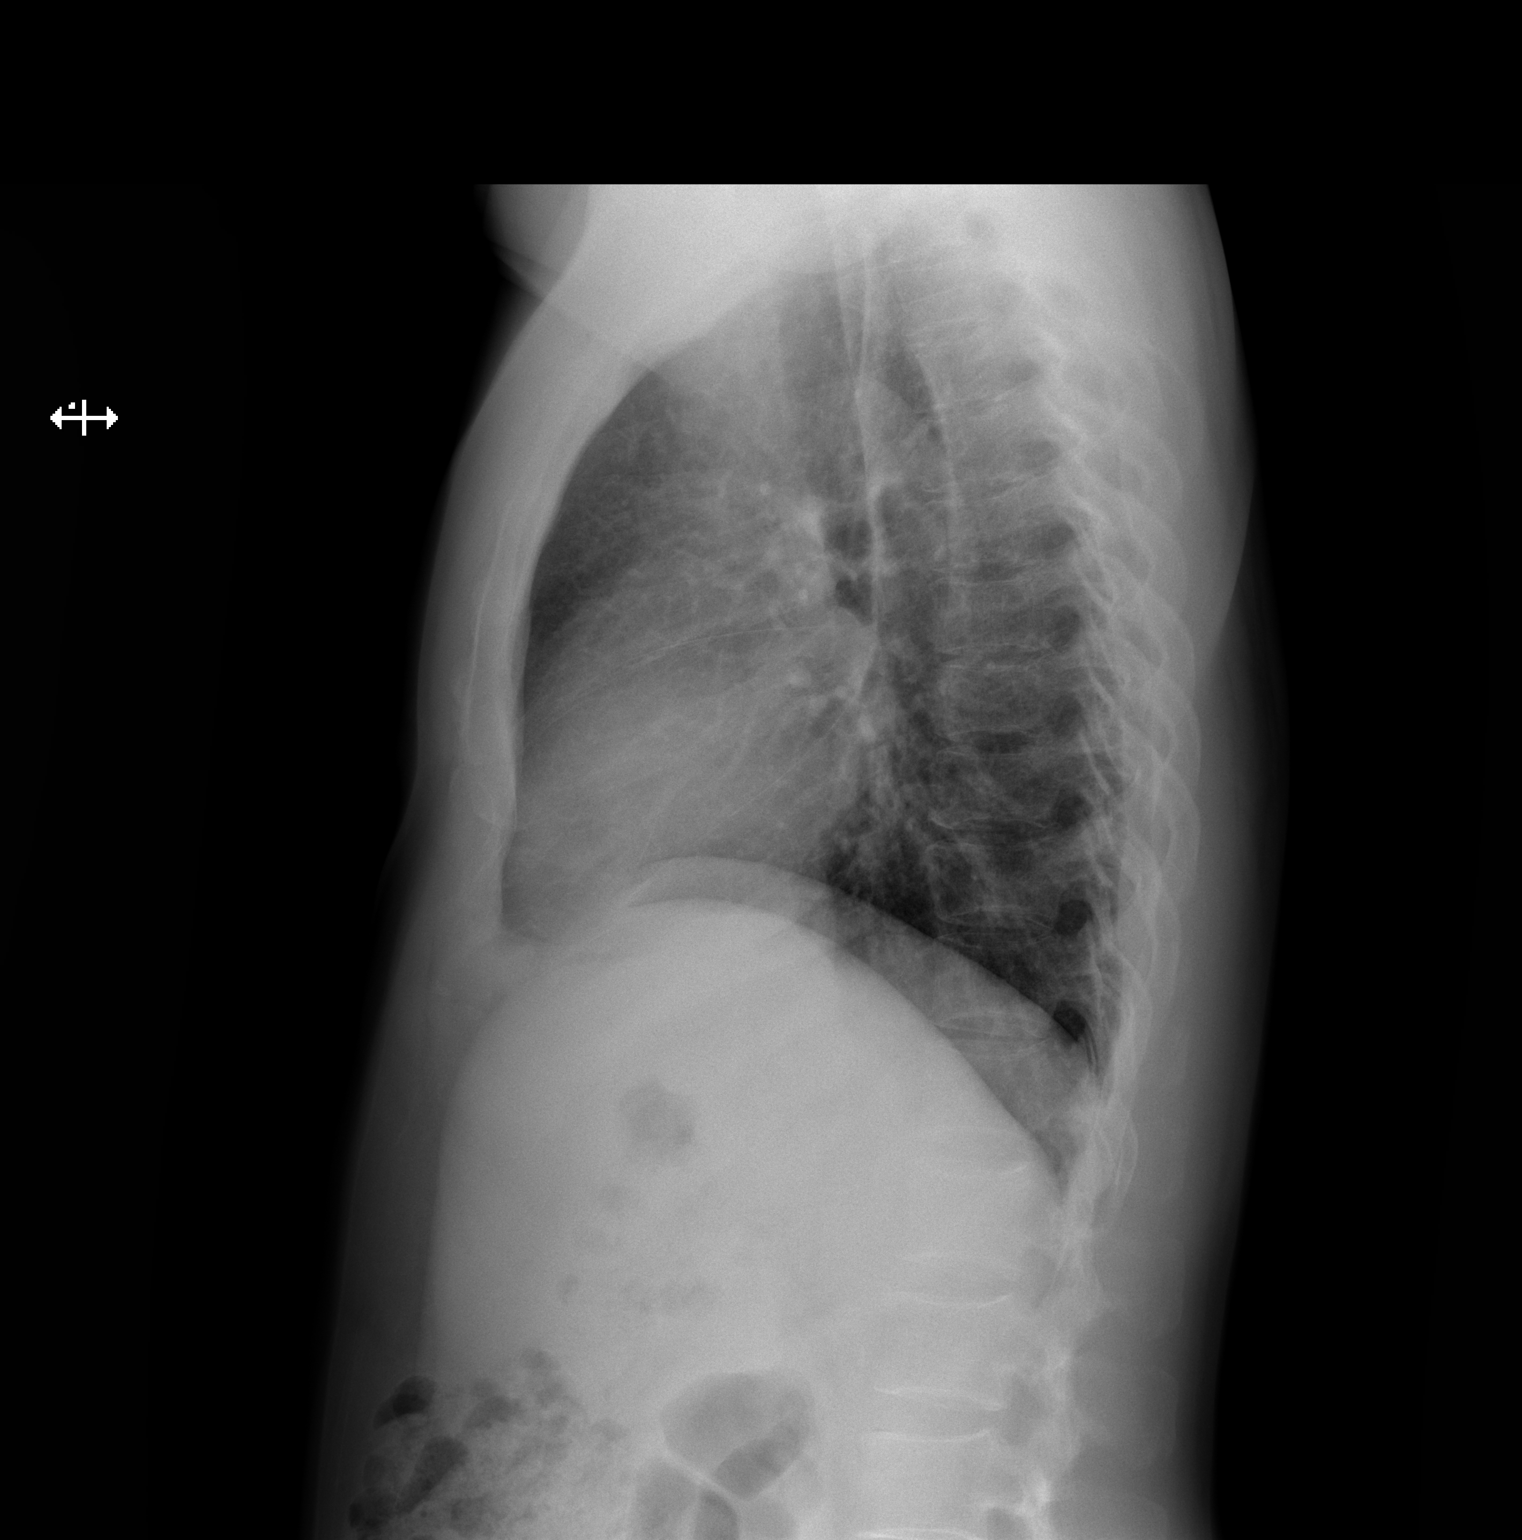

[2 of 2 positions shown; findings below may reference images not displayed]

FINDINGS: Cardiomediastinal silhouette unchanged in size and contour. No
calcified lymph nodes identified.

Trace nodularity at the left lung base, similar to the comparison,
and compatible with the lingular nodule on prior CT.

No pneumothorax pleural effusion or new confluent airspace disease.

No displaced fracture
IMPRESSION: Redemonstration of nodularity of the left lung base, unchanged from
prior chest x-ray and CT, with no evidence of acute cardiopulmonary
disease

## 2021-07-15 ENCOUNTER — Other Ambulatory Visit: Payer: Self-pay

## 2021-07-15 DIAGNOSIS — B20 Human immunodeficiency virus [HIV] disease: Secondary | ICD-10-CM

## 2021-07-15 DIAGNOSIS — Z113 Encounter for screening for infections with a predominantly sexual mode of transmission: Secondary | ICD-10-CM

## 2021-07-15 DIAGNOSIS — Z1159 Encounter for screening for other viral diseases: Secondary | ICD-10-CM

## 2021-07-17 LAB — COMPREHENSIVE METABOLIC PANEL
AG Ratio: 0.9 (calc) — ABNORMAL LOW (ref 1.0–2.5)
ALT: 16 U/L (ref 9–46)
AST: 19 U/L (ref 10–35)
Albumin: 3.9 g/dL (ref 3.6–5.1)
Alkaline phosphatase (APISO): 148 U/L — ABNORMAL HIGH (ref 35–144)
BUN: 18 mg/dL (ref 7–25)
CO2: 28 mmol/L (ref 20–32)
Calcium: 9.4 mg/dL (ref 8.6–10.3)
Chloride: 101 mmol/L (ref 98–110)
Creat: 1.3 mg/dL (ref 0.70–1.30)
Globulin: 4.2 g/dL (calc) — ABNORMAL HIGH (ref 1.9–3.7)
Glucose, Bld: 138 mg/dL — ABNORMAL HIGH (ref 65–99)
Potassium: 4.1 mmol/L (ref 3.5–5.3)
Sodium: 136 mmol/L (ref 135–146)
Total Bilirubin: 0.5 mg/dL (ref 0.2–1.2)
Total Protein: 8.1 g/dL (ref 6.1–8.1)

## 2021-07-17 LAB — CBC
HCT: 43.5 % (ref 38.5–50.0)
Hemoglobin: 14.3 g/dL (ref 13.2–17.1)
MCH: 32.1 pg (ref 27.0–33.0)
MCHC: 32.9 g/dL (ref 32.0–36.0)
MCV: 97.8 fL (ref 80.0–100.0)
MPV: 11.3 fL (ref 7.5–12.5)
Platelets: 160 10*3/uL (ref 140–400)
RBC: 4.45 10*6/uL (ref 4.20–5.80)
RDW: 13.1 % (ref 11.0–15.0)
WBC: 5.7 10*3/uL (ref 3.8–10.8)

## 2021-07-17 LAB — T-HELPER CELLS (CD4) COUNT (NOT AT ARMC)
Absolute CD4: 105 cells/uL — ABNORMAL LOW (ref 490–1740)
CD4 T Helper %: 7 % — ABNORMAL LOW (ref 30–61)
Total lymphocyte count: 1499 cells/uL (ref 850–3900)

## 2021-07-17 LAB — HEPATITIS B CORE ANTIBODY, TOTAL: Hep B Core Total Ab: NONREACTIVE

## 2021-07-17 LAB — HEPATITIS B SURFACE ANTIGEN: Hepatitis B Surface Ag: NONREACTIVE

## 2021-07-17 LAB — HIV-1 RNA QUANT-NO REFLEX-BLD
HIV 1 RNA Quant: 32 Copies/mL — ABNORMAL HIGH
HIV-1 RNA Quant, Log: 1.5 Log cps/mL — ABNORMAL HIGH

## 2021-07-17 LAB — HEPATITIS C ANTIBODY
Hepatitis C Ab: NONREACTIVE
SIGNAL TO CUT-OFF: 0.38 (ref <1.00)

## 2021-07-17 LAB — C. TRACHOMATIS/N. GONORRHOEAE RNA
C. trachomatis RNA, TMA: NOT DETECTED
N. gonorrhoeae RNA, TMA: NOT DETECTED

## 2021-07-17 LAB — HEPATITIS B SURFACE ANTIBODY, QUANTITATIVE: Hep B S AB Quant (Post): <5 m[IU]/mL — ABNORMAL LOW (ref >=10)

## 2021-07-17 LAB — RPR: RPR Ser Ql: NONREACTIVE

## 2021-07-25 ENCOUNTER — Ambulatory Visit (INDEPENDENT_AMBULATORY_CARE_PROVIDER_SITE_OTHER): Payer: Self-pay | Admitting: Internal Medicine

## 2021-07-25 ENCOUNTER — Encounter: Payer: Self-pay | Admitting: Internal Medicine

## 2021-07-25 ENCOUNTER — Other Ambulatory Visit: Payer: Self-pay

## 2021-07-25 VITALS — BP 126/78 | HR 67 | Temp 98.2°F | Wt 136.0 lb

## 2021-07-25 DIAGNOSIS — B20 Human immunodeficiency virus [HIV] disease: Secondary | ICD-10-CM

## 2021-07-25 DIAGNOSIS — Z113 Encounter for screening for infections with a predominantly sexual mode of transmission: Secondary | ICD-10-CM

## 2021-07-25 NOTE — Progress Notes (Signed)
Chief complaint: f/u pulm TB and hiv care  Subjective:    Patient ID: Manuel Hayes, male    DOB: 03-31-1971, 51 y.o.   MRN: [id number]  Cc: hiv care  HPI 51 yo hispanic speaking only male, dm2, hlp recently dx'ed with HIV and pulm TB on antituberculous medication, here for f/u  Hx is via him/Spanish interpreter     Background: ----------------- He was seen/established care with dr Tommy Medal initially at [facility](or at least to get on ART) -TB regimen started almost 2 months. He was changed from rifampin to rifabutin for preparation of starting ART.  -he doesn't know how he got hiv. No hx ivdu. Heterosexual. Prior married in [place]. 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 He has labs monitored along with recent sputum  He still has some dry mouth. He denies headache, visual blurriness, eye pain, visual loss  He is eating well and gaining weight  No n/v/rash, joint pain, diarrhea.   dovato started 10/14  meds reviewed: Dovato His home dm2 meds TB meds   04/02/20 id f/u Doing well, gaining weight (110 --> 130 pounds) Feeling well, no complaint Cough much improved Not seeing his PCP at [facility] as afraid he can't afford medications. Off and on lovastatin, metformin, glipizide, and lisinopril and levothyroxine Taking dovato; not taking bactrim Still getting DOT for tb meds. Finished with intensive therapy 2 months on 10/30. He is on b6, rifabutin, and inh. No side effect   12/16 id f/u HIV no missed dose pulm tb -- on DOT. No concern. He doesn't know when his last sputum testing was Patient still haven't seen his pcp at [facility] clinic (financial) He brings his pill box today dovato Bactrim Nystatin  liquid Lisinopril Metformin Valacyclovir (he is supposed to be on synthroid, lovastatin, and glipizide as well); last sugar check was very high He in the mean time developed: 1) right eye pain/redness 3 days; no f/c or decreased vision. He has an eye doctor next visit 05/2020. He is on tb treatment on continuation phase now. His last cxr 03/2020 showed scarring left lower lobe. No f/c/nightsweat/cough/weight loss. Previously has some blurry vision but initial ophthalmologic visit no concern for infection; serologic w/u syphilis/toxo/cmv negative. No penile rash/discharge 2) recently seen by urgent care for right thigh shingles given valacyclovir; has 1 more pill to take. Lesions had scabbed over; pain much better  Labs to be done today   05/31/2020 id f/u He was referred and finally saw internal medicine clinic 1/07 [facility] He returns today for tb/hiv care [place] had resolved Never saw ophthalmology (awaiting financial aid), however eye redness/discomfort resolved No f/c/n/v/diarrhea No cough Reviewed labs. Platelet has been at just above 50 Getting DOT for tb tx Compliant no missing dose of dovato  07/24/20 id f/u Complain of bilateral hand pain in the ip joints for about 1-2 month and also ankle/toes. These are most pronounced in the heels and the fingers. No wrist, knees, elbows or back pain. Some shoulder pain No skin rash No fever, chill Pain stable, 8/10. Constant daily.  No swelling Sx improves as the day goes along. Sx worst in the morning, and after prolonged rest including stiffness No family hx rheumatoid arthritis or lupus or autoimmune  disease No fatigue, oral/genital ulcer, new visual changes/blurriness/pain No dysuria/penile discharge   08/23/20 id f/u Patient is back here today for ongoing joint pain (wrist/hands, hip, knees/heels, elbows) Finished TB treatment already on 4/06 No fever, chill Good/normal energy level No  depression/insomnia/anxiety We review labs from last visit --> crp, lft normal; wbc normal; platelet at 100s He is on bid tivicay and daily descovy right now due to tb meds    10/02/20 Doing well Bilateral hands pain chronic No f/c/n/v/diarrhea Takes dovato everyday no missed dose  Hx via language interpreter   01/03/2021 Hx via language interpreter Patient without complaint No f/c/nightsweat/weight loss/decreased appetite His joint pain hurts a lot less now in hands and left shoulder. It hurts more at night not particularly at rest.  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 Renting a room from a friend No sexual relationship or encounter Wife in [place], haven't met in more than 5 years; he sends money to her   07/25/2021 id clinic follow up Tolerating dovato Michela Pitcher he is taking 2 medications prescribed by [place] and other medictions from medicine clinic He is adamant that he is not missing any dose of dovato He takes it before his food (breakfast) He doesn't take any vitamins/minerals rx'ed or over the counter Labs done 07/15/21 reviewed Lab Results  Component Value Date   CD4TCELL 7 (L) 07/15/2021   CD4TABS 129 (L) 07/24/2020  Cd4 105 (stagnant around 7-8% the last 2 years) Rpr nonreactive; hep c ab and hep b sag/core ab/sAb nonreactive Hiv rna 32; one time undetected 10/02/20 and 07/24/20 other than that low level viremia but <100 since 04/2020 (tx started 01/2020)  Good appetite No weight loss No cough; no night sweat/f/c  Denies sexual encounter since (although he was taking condoms with him). He wants std screen. He has sex with women only   ROS: All other ros negative   Past Medical History:  Diagnosis Date   HIV disease ([place]) 02/08/2020   Hyperlipidemia    Pulmonary nodule    Pulmonary tuberculosis 02/08/2020    No past surgical  history on file.  Family History  Problem Relation Age of Onset   Diabetes Mother    Diabetes Brother       Social History   Socioeconomic History   Marital status: Single    Spouse name: Not on file   Number of children: Not on file   Years of education: Not on file   Highest education level: Not on file  Occupational History   Not on file  Tobacco Use   Smoking status: Never   Smokeless tobacco: Never  Substance and Sexual Activity   Alcohol use: Not Currently   Drug use: Never   Sexual activity: Yes    Partners: Female    Comment: accepted condoms  Other Topics Concern   Not on file  Social History Narrative   Not on file   Social Determinants of Health   Financial Resource Strain: Not on file  Food Insecurity: Not on file  Transportation Needs: Not on file  Physical Activity: Not on file  Stress: Not on file  Social Connections: Not on file    No Known Allergies   Current Outpatient Medications:    Alcohol Swabs PADS, Clean fingers/equipments prior to CBG testing of your fingers, Disp: 100 each,  Rfl: 11   atorvastatin (LIPITOR) 10 MG tablet, Take 1 tablet (10 mg total) by mouth daily., Disp: 90 tablet, Rfl: 3   blood glucose meter kit and supplies KIT, Dispense based on patient and insurance preference. Use up to four times daily as directed. (FOR ICD-9 250.00, 250.01)., Disp: 1 each, Rfl: 0   Canagliflozin-metFORMIN HCl ER (INVOKAMET XR) 50-1000 MG TB24, TAKE 1 TABLET BY MOUTH DAILY, Disp: 90 tablet, Rfl: 3   DOVATO 50-300 MG tablet, TAKE 1 TABLET BY MOUTH DAILY, Disp: 30 tablet, Rfl: 0   gabapentin (NEURONTIN) 100 MG capsule, Take 1 capsule (100 mg total) by mouth 3 (three) times daily. (Patient not taking: No sig reported), Disp: 90 capsule, Rfl: 2   glucose blood test strip, USE 1 TO CHECK BLOOD GLUCOSE UP TO 7 TIMES A WEEK. (USELO PARA CONTROLAR EL NIVEL DE AZUCAR EN LA SANGRE HASTA 7 VECES A LA [place]). (Patient taking  differently: USE 1 TO CHECK BLOOD GLUCOSE UP TO 7 TIMES A WEEK. (USELO PARA CONTROLAR EL NIVEL DE AZUCAR EN LA SANGRE HASTA 7 VECES A LA [place]).), Disp: 100 strip, Rfl: 3   hydrocortisone (ANUSOL-HC) 2.5 % rectal cream, Proctosol HC 2.5 % topical cream perineal applicator  APPLY TO RECTUM 2 3 TIMES DAILY, Disp: , Rfl:    levothyroxine (SYNTHROID) 25 MCG tablet, Take 1.5 tablets (37.5 mcg total) by mouth daily before breakfast., Disp: 45 tablet, Rfl: 11   lisinopril (ZESTRIL) 10 MG tablet, Take 1 tablet (10 mg total) by mouth daily., Disp: 90 tablet, Rfl: 3   Microlet Lancets MISC, USE 1 UP TO 7 TIMES A WEEK. (USELO PARA CONTROLAR EL NIVEL DE AZUCAR EN LA SANGRE SEGUN LAS INSTRUCCIONES HASTA 7 VECES A LA [place]., Disp: 100 each, Rfl: 3   Omega-3 Fatty Acids (FISH OIL PO), Take by mouth., Disp: , Rfl:       Objective:    Vitals reviewed General/constitutional: no distress, pleasant HEENT: Normocephalic, PER, Conj Clear, EOMI, Oropharynx clear Neck supple CV: rrr no mrg Lungs: clear to auscultation, normal respiratory effort Abd: Soft, Nontender Ext: no edema Skin: No Rash Neuro: nonfocal MSK: no peripheral joint swelling/tenderness/warmth; back spines nontender        Labs: Lab Results  Component Value Date   WBC 5.7 07/15/2021   HGB 14.3 07/15/2021   HCT 43.5 07/15/2021   MCV 97.8 07/15/2021   PLT 160 04/02/5207   Last metabolic panel Lab Results  Component Value Date   GLUCOSE 138 (H) 07/15/2021   NA 136 07/15/2021   K 4.1 07/15/2021   CL 101 07/15/2021   CO2 28 07/15/2021   BUN 18 07/15/2021   CREATININE 1.30 07/15/2021   GFRNONAA 63 07/24/2020   GFRAA 73 07/24/2020   CALCIUM 9.4 07/15/2021   PROT 8.1 07/15/2021   ALBUMIN 2.2 (L) 11/20/2019   BILITOT 0.5 07/15/2021   ALKPHOS 331 (H) 11/20/2019   AST 19 07/15/2021   ALT 16 07/15/2021   ANIONGAP 6 11/20/2019    3/09 cr 1.3; lft normal; cbc 4/13/103; tsh 4.43; a1c 6.3; ana negative;  rf/ccp negative 3/09 lipid 180; 36; 103; 310  Serology: 06/2021 rpr negative; hepatis acute panel nr  01/2020 toxoplasma IgG positive; hep b sAg/sAb/cAb NR; hep C ab negative; Hep A ab reactive; cmv IgM and hsv1&2 IgM negative; vzv igg positive 01/2020 urine gc/chlam pcr negative 01/2020 rpr negative  hiv labs        cd4 (%)  /  VL 9/23              50 (3%) / 197k 11/16                          / 373 12/16           166 (6)    /  66 05/31/20                     /  25 07/2020        129 (8)   /  <20 04/29                          /  47 05/18                          /  undetectable 12/19/20                     /  50s 06/2021        107 (7)  /   30s  Imaging: 11/16 chest xray left lung scarring  08/21/20 chest xray Redemonstration of nodularity of the left lung base, unchanged from prior chest x-ray and CT, with no evidence of acute cardiopulmonary disease      Assessment & Plan:   Patient Active Problem List   Diagnosis Date Noted   Need for tetanus, diphtheria, and acellular pertussis (Tdap) vaccine 09/06/2020   Hypertension associated with diabetes ([place]) 09/06/2020   Acquired hypothyroidism 09/06/2020   Herpes zoster 04/22/2020   Pulmonary tuberculosis 02/08/2020   HIV disease ([place]) 02/08/2020   Lung nodules 01/04/2020   Pruritic rash 03/16/2018   Anemia 12/27/2017   Hyperproteinemia 07/05/2017   Hypoalbuminemia 07/05/2017   Positive for macroalbuminuria 11/21/2016   Elevated liver enzymes 08/21/2016   Benign essential hypertension 08/21/2016   Hyperlipidemia associated with type 2 diabetes mellitus ([place]) 08/21/2016   Type 2 diabetes mellitus ([place]) 08/21/2016   Blurring of visual image 08/21/2016    #Pulmonary TB: #thrombocytopenia, meds induced Started ATB tx first week of 01/2020. Finished intensive tx 10/29.  AFB sputum surveillance ngative on 9/5, 6, 19, 20, 10/3, 4, 17, 18.  03/2020 cxr residual scarring LLL, no cavity Developed thrombocytopenia into 50s,  thought to be related to rifampin, but improved Finished tb meds 4/6. Platelet recovered while on tb meds   #AIDS/HIV disease: started on dovato 02/29/2020. No prior therapy (new dx and referred to our clinic). No evidence Hepatitis B. Genotype 02/08/2020 no RTI or PI resistance (mutations -- RT a98S, v118I, R211k; PR I13v, d60e, l63p, v77i). He is doing very well on Dovato, and very compliant. Viral load undetectable 07/2020  Blips still. Report compliance. No obvious ddi. Not taking vitamins/minerals  -take dovato at night before bedtime -continue bactrim ds tiw -discuss U=U -encourage continued compliance -do labs 6 months and see me after   #hands pain #left shoulder pain Resolved as of 07/25/2021 Thought to be related to tb meds previously   [facility] -vaccination/hepatitis b  No hx hep b exposure/vaccination Cd4 still <200, so will defer getting vaccine  -std screening He appears to be sexually active Will screen urine gc/chlam today -hx tb. No b sx -cancer screening Discussed with patient to talk to pcp to Refer for colonoscopy   I have spent a total of 20 minutes of face-to-face and non-face-to-face time,  excluding clinical staff time, preparing to see patient, ordering tests and/or medications, and provide counseling the patient

## 2021-07-25 NOTE — Patient Instructions (Signed)
Take dovato before bed time, removed at least 2 hours from any food or vitamins. Do not take vitamins with dovato ? ? ?Make sure you do not miss any dose of dovato ? ? ?If your viral load goes up, will repeat genetic testing ? ? ?Once your cd4 count goes above 200, will discuss vaccination ? ? ?Talk to your regular doctor about colon cancer screening ?

## 2021-07-27 LAB — COMPREHENSIVE METABOLIC PANEL
AG Ratio: 1 (calc) (ref 1.0–2.5)
ALT: 17 U/L (ref 9–46)
AST: 23 U/L (ref 10–35)
Albumin: 4.1 g/dL (ref 3.6–5.1)
Alkaline phosphatase (APISO): 164 U/L — ABNORMAL HIGH (ref 35–144)
BUN/Creatinine Ratio: 23 (calc) — ABNORMAL HIGH (ref 6–22)
BUN: 28 mg/dL — ABNORMAL HIGH (ref 7–25)
CO2: 27 mmol/L (ref 20–32)
Calcium: 9.9 mg/dL (ref 8.6–10.3)
Chloride: 104 mmol/L (ref 98–110)
Creat: 1.22 mg/dL (ref 0.70–1.30)
Globulin: 4.3 g/dL (calc) — ABNORMAL HIGH (ref 1.9–3.7)
Glucose, Bld: 139 mg/dL — ABNORMAL HIGH (ref 65–99)
Potassium: 4.4 mmol/L (ref 3.5–5.3)
Sodium: 137 mmol/L (ref 135–146)
Total Bilirubin: 0.5 mg/dL (ref 0.2–1.2)
Total Protein: 8.4 g/dL — ABNORMAL HIGH (ref 6.1–8.1)

## 2021-07-27 LAB — CBC
HCT: 42.7 % (ref 38.5–50.0)
Hemoglobin: 14.3 g/dL (ref 13.2–17.1)
MCH: 32.2 pg (ref 27.0–33.0)
MCHC: 33.5 g/dL (ref 32.0–36.0)
MCV: 96.2 fL (ref 80.0–100.0)
MPV: 11.4 fL (ref 7.5–12.5)
Platelets: 153 10*3/uL (ref 140–400)
RBC: 4.44 10*6/uL (ref 4.20–5.80)
RDW: 13.3 % (ref 11.0–15.0)
WBC: 6.9 10*3/uL (ref 3.8–10.8)

## 2021-07-27 LAB — TRICHOMONAS VAGINALIS, PROBE AMP: Trichomonas vaginalis RNA: NOT DETECTED

## 2021-07-27 LAB — HIV-1 RNA QUANT-NO REFLEX-BLD
HIV 1 RNA Quant: <20 Copies/mL — ABNORMAL HIGH
HIV-1 RNA Quant, Log: <1.3 Log cps/mL — ABNORMAL HIGH

## 2021-07-27 LAB — RPR: RPR Ser Ql: NONREACTIVE

## 2021-07-27 LAB — C. TRACHOMATIS/N. GONORRHOEAE RNA
C. trachomatis RNA, TMA: NOT DETECTED
N. gonorrhoeae RNA, TMA: NOT DETECTED

## 2021-07-29 ENCOUNTER — Other Ambulatory Visit: Payer: Self-pay

## 2021-07-29 ENCOUNTER — Ambulatory Visit: Payer: Self-pay | Attending: Internal Medicine

## 2021-07-29 DIAGNOSIS — E039 Hypothyroidism, unspecified: Secondary | ICD-10-CM

## 2021-07-30 ENCOUNTER — Other Ambulatory Visit: Payer: Self-pay | Admitting: Internal Medicine

## 2021-07-30 LAB — THYROID PANEL WITH TSH
Free Thyroxine Index: 1.9 (ref 1.2–4.9)
T3 Uptake Ratio: 30 % (ref 24–39)
T4, Total: 6.4 ug/dL (ref 4.5–12.0)
TSH: 4.73 u[IU]/mL — ABNORMAL HIGH (ref 0.450–4.500)

## 2021-07-30 MED ORDER — LEVOTHYROXINE SODIUM 50 MCG PO TABS: 50.0000 ug | ORAL_TABLET | Freq: Every day | ORAL | 5 refills | Status: DC

## 2021-07-30 NOTE — Progress Notes (Signed)
Let pt know that his thyroid level has improved but still not at goal.  I recommend increasing the Levothyroxine from 25 mcg 1 and 1/2 tabs daily to 50 mcg daily.  This means he will start taking two of the 25 mcg tabs until he runs out of his current bottle.  I will send an updated rxn to the his pharmacy for the 50 mcg tablet.  Return to lab for recheck after being on the higher dose for 6 wks.  Please call his pharmacy and cancel any remain refills on the 25 mg prescription and let them know an updated dose sent.  Thanks

## 2021-08-07 ENCOUNTER — Other Ambulatory Visit: Payer: Self-pay | Admitting: Internal Medicine

## 2021-08-07 DIAGNOSIS — B20 Human immunodeficiency virus [HIV] disease: Secondary | ICD-10-CM

## 2021-08-12 ENCOUNTER — Other Ambulatory Visit: Payer: Self-pay | Admitting: Internal Medicine

## 2021-09-03 ENCOUNTER — Other Ambulatory Visit: Payer: Self-pay | Admitting: Internal Medicine

## 2021-09-09 ENCOUNTER — Other Ambulatory Visit: Payer: Self-pay

## 2021-09-09 ENCOUNTER — Other Ambulatory Visit: Payer: Self-pay | Admitting: Internal Medicine

## 2021-09-09 DIAGNOSIS — I152 Hypertension secondary to endocrine disorders: Secondary | ICD-10-CM

## 2021-09-09 DIAGNOSIS — E1159 Type 2 diabetes mellitus with other circulatory complications: Secondary | ICD-10-CM

## 2021-09-10 ENCOUNTER — Other Ambulatory Visit: Payer: Self-pay

## 2021-09-10 MED ORDER — LISINOPRIL 10 MG PO TABS
10.0000 mg | ORAL_TABLET | Freq: Every day | ORAL | 0 refills | Status: DC
  Filled 2021-09-10: qty 30, 30d supply, fill #0

## 2021-09-30 ENCOUNTER — Other Ambulatory Visit: Payer: Self-pay

## 2021-09-30 ENCOUNTER — Encounter: Payer: Self-pay | Admitting: Internal Medicine

## 2021-09-30 ENCOUNTER — Ambulatory Visit: Payer: Self-pay | Attending: Internal Medicine | Admitting: Internal Medicine

## 2021-09-30 VITALS — BP 113/75 | HR 77 | Temp 98.9°F | Resp 18 | Ht 61.5 in | Wt 134.0 lb

## 2021-09-30 DIAGNOSIS — E1169 Type 2 diabetes mellitus with other specified complication: Secondary | ICD-10-CM

## 2021-09-30 DIAGNOSIS — Z23 Encounter for immunization: Secondary | ICD-10-CM

## 2021-09-30 DIAGNOSIS — R809 Proteinuria, unspecified: Secondary | ICD-10-CM

## 2021-09-30 DIAGNOSIS — E1159 Type 2 diabetes mellitus with other circulatory complications: Secondary | ICD-10-CM

## 2021-09-30 DIAGNOSIS — I152 Hypertension secondary to endocrine disorders: Secondary | ICD-10-CM

## 2021-09-30 DIAGNOSIS — E039 Hypothyroidism, unspecified: Secondary | ICD-10-CM

## 2021-09-30 DIAGNOSIS — Z1211 Encounter for screening for malignant neoplasm of colon: Secondary | ICD-10-CM

## 2021-09-30 DIAGNOSIS — E1129 Type 2 diabetes mellitus with other diabetic kidney complication: Secondary | ICD-10-CM

## 2021-09-30 DIAGNOSIS — E785 Hyperlipidemia, unspecified: Secondary | ICD-10-CM

## 2021-09-30 LAB — POCT GLYCOSYLATED HEMOGLOBIN (HGB A1C): Hemoglobin A1C: 7.5 % — AB (ref 4.0–5.6)

## 2021-09-30 LAB — GLUCOSE, POCT (MANUAL RESULT ENTRY): POC Glucose: 239 mg/dl — AB (ref 70–99)

## 2021-09-30 MED ORDER — ATORVASTATIN CALCIUM 10 MG PO TABS
10.0000 mg | ORAL_TABLET | Freq: Every day | ORAL | 3 refills | Status: DC
  Filled 2021-09-30 – 2021-12-12 (×2): qty 90, 90d supply, fill #0

## 2021-09-30 MED ORDER — LISINOPRIL 10 MG PO TABS
10.0000 mg | ORAL_TABLET | Freq: Every day | ORAL | 2 refills | Status: DC
  Filled 2021-09-30 – 2021-12-12 (×2): qty 90, 90d supply, fill #0

## 2021-09-30 MED ORDER — METFORMIN HCL ER 500 MG PO TB24
500.0000 mg | ORAL_TABLET | Freq: Every day | ORAL | 1 refills | Status: DC
  Filled 2021-09-30: qty 90, 90d supply, fill #0

## 2021-09-30 NOTE — Progress Notes (Signed)
? ? ?Patient ID: Manuel Hayes, male    DOB: 1971-01-30  MRN: [id number] ? ?CC: Chronic disease management. ? ?Subjective: ?Manuel Hayes is a 51 y.o. male who presents for chronic disease management. 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  ? ?Pt has meds with him today. ? ?DIABETES TYPE 2 ?Last A1C:   ?Results for orders placed or performed in visit on 09/30/21  ?HgB A1c  ?Result Value Ref Range  ? Hemoglobin A1C 7.5 (A) 4.0 - 5.6 %  ? HbA1c POC (<> result, manual entry)    ? HbA1c, POC (prediabetic range)    ? HbA1c, POC (controlled diabetic range)    ?Glucose (CBG)  ?Result Value Ref Range  ? POC Glucose 239 (A) 70 - 99 mg/dl  ?  ?Med Adherence:  [x]  Yes on Invokamet XR 50-1000 mg daily ?Medication side effects:  []  Yes    [x]  No ?Home Monitoring?  [x]  Yes  daily to every other day before a meal ?Home glucose results range: 140-150 ?Diet Adherence: [x]  Yes    []  No ?Exercise: [x]  Yes - reports he is very active at work.  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 []  Yes    []  No ?Retinopathy hx? []  Yes    []  No ?Last eye exam:  no eye exam as yet.  No insurance but plans to get done at [facility] ?Comments: had macroalbuminuria which has decreased ? ?Thyroid: Previously on Levothyroxine 25 mcg 1.5 daily.  We increased to 50 mcg after last TSH was 4.7.  However pt tells me he has been taking 50 mcg 1.5 mcg daily in error (75 mcg) even though the bottle says one tablet daily. ?-denies any wgh changes or palpitations.  ? ?HIV:  taking Dovato and Bactrim as prescribed.  Followed by ID Dr. Gale Journey. ? ?HM:  due for colon cancer screen.  Due for DM eye exam.  Due for Tdapt and Zoster vaccine.   ? ?Patient Active Problem List  ? Diagnosis Date Noted  ? Need for tetanus, diphtheria, and acellular pertussis (Tdap) vaccine 09/06/2020  ? Hypertension  associated with diabetes ([place]) 09/06/2020  ? Acquired hypothyroidism 09/06/2020  ? Herpes zoster 04/22/2020  ? 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 HIV disease ([place]) 02/08/2020  ? Lung nodules 01/04/2020  ? Pruritic rash 03/16/2018  ? Anemia 12/27/2017  ? Hyperproteinemia 07/05/2017  ? Hypoalbuminemia 07/05/2017  ? Positive for macroalbuminuria 11/21/2016  ? Elevated liver enzymes 08/21/2016  ? Benign essential hypertension 08/21/2016  ? Hyperlipidemia associated with type 2 diabetes mellitus ([place]) 08/21/2016  ? Type 2 diabetes mellitus ([place]) 08/21/2016  ? Blurring of visual image 08/21/2016  ?  ? ?Current Outpatient Medications on File Prior to Visit  ?Medication Sig Dispense Refill  ? Alcohol Swabs PADS Clean fingers/equipments prior to CBG testing of your fingers 100 each 11  ? blood glucose meter kit and supplies KIT Dispense based on patient and insurance preference. Use up to four times daily as directed. (FOR ICD-9 250.00, 250.01). 1 each 0  ? Canagliflozin-metFORMIN HCl ER (INVOKAMET XR) 50-1000 MG TB24 TAKE 1 TABLET BY MOUTH DAILY 90 tablet 3  ? DOVATO 50-300 MG tablet TAKE 1 TABLET BY MOUTH DAILY 30 tablet 6  ? glucose blood test strip USE 1 TO CHECK BLOOD GLUCOSE UP TO 7 TIMES A WEEK. (  USELO PARA CONTROLAR EL NIVEL DE AZUCAR EN LA SANGRE HASTA 7 VECES A LA [place] SEGUN LAS INSTRUCCIONES). (Patient taking differently: USE 1 TO CHECK BLOOD GLUCOSE UP TO 7 TIMES A WEEK. (USELO PARA CONTROLAR EL NIVEL DE AZUCAR EN LA SANGRE HASTA 7 VECES A LA [place]).) 100 strip 3  ? levothyroxine (SYNTHROID) 50 MCG tablet Take 1 tablet (50 mcg total) by mouth daily before breakfast. 30 tablet 5  ? Microlet Lancets MISC USE 1 UP TO 7 TIMES A WEEK. (USELO PARA CONTROLAR EL NIVEL DE AZUCAR EN LA SANGRE SEGUN LAS INSTRUCCIONES HASTA 7 VECES A LA [place]. 100 each 3  ? Omega-3 Fatty Acids (FISH OIL PO) Take by mouth.    ? sulfamethoxazole-trimethoprim (BACTRIM DS) 800-160 MG tablet TAKE 1 TABLET BY MOUTH 3  TIMES A WEEK 36 tablet 2  ? gabapentin (NEURONTIN) 100 MG capsule Take 1 capsule (100 mg total) by mouth 3 (three) times daily. (Patient not taking: Reported on 05/02/2020) 90 capsule 2  ? hydrocortisone (ANUSOL-HC) 2.5 % rectal cream Proctosol HC 2.5 % topical cream perineal applicator ? APPLY TO RECTUM 2 3 TIMES DAILY (Patient not taking: Reported on 07/25/2021)    ? ?No current facility-administered medications on file prior to visit.  ? ? ?No Known Allergies ? ?Social History  ? ?Socioeconomic History  ? Marital status: Single  ?  Spouse name: Not on file  ? Number of children: Not on file  ? Years of education: Not on file  ? Highest education level: Not on file  ?Occupational History  ? Not on file  ?Tobacco Use  ? Smoking status: Never  ? Smokeless tobacco: Never  ?Substance and Sexual Activity  ? Alcohol use: Not Currently  ? Drug use: Never  ? Sexual activity: Yes  ?  Partners: Female  ?  Comment: accepted condoms  ?Other Topics Concern  ? Not on file  ?Social History Narrative  ? Not on file  ? ?Social Determinants of Health  ? ?Financial Resource Strain: Not on file  ?Food Insecurity: Not on file  ?Transportation Needs: Not on file  ?Physical Activity: Not on file  ?Stress: Not on file  ?Social Connections: Not on file  ?Intimate Partner Violence: Not on file  ? ? ?Family History  ?Problem Relation Age of Onset  ? Diabetes Mother   ? Diabetes Brother   ? ? ?History reviewed. No pertinent surgical history. ? ?ROS: ?Review of Systems ?Negative except as stated above ? ?PHYSICAL EXAM: ?BP 113/75 (BP Location: Left Arm, Patient Position: Sitting, Cuff Size: Normal)   Pulse 77   Temp 98.9 ?F (37.2 ?C) (Oral)   Resp 18   Ht 5' 1.5" (1.562 m)   Wt 134 lb (60.8 kg)   SpO2 98%   BMI 24.91 kg/m?   ?Wt Readings from Last 3 Encounters:  ?09/30/21 134 lb (60.8 kg)  ?07/25/21 136 lb (61.7 kg)  ?01/03/21 135 lb (61.2 kg)  ? ? ?Physical Exam ? ? ?General appearance - alert, well appearing, middle age Hispanic male  and in no distress ?Mental status - normal mood, behavior, speech, dress, motor activity, and thought processes ?Neck - supple, no significant adenopathy ?Chest - clear to auscultation, no wheezes, rales or rhonchi, symmetric air entry ?Heart - normal rate, regular rhythm, normal S1, S2, no murmurs, rubs, clicks or gallops ?Extremities - peripheral pulses normal, no pedal edema, no clubbing or cyanosis ?Diabetic Foot Exam - Simple   ?Simple Foot Form ?Diabetic  Foot exam was performed with the following findings: Yes 09/30/2021  2:50 PM  ?Visual Inspection ?See comments: Yes ?Sensation Testing ?Intact to touch and monofilament testing bilaterally: Yes ?Pulse Check ?Posterior Tibialis and Dorsalis pulse intact bilaterally: Yes ?Comments ?1.5 cm callous lateral aspect of 5th CM jt BL.  No ulcers ?  ? ? ? ?  Latest Ref Rng & Units 07/25/2021  ? 10:13 AM 07/15/2021  ?  9:08 AM 12/19/2020  ?  9:10 AM  ?CMP  ?Glucose 65 - 99 mg/dL 139   138   139    ?BUN 7 - 25 mg/dL 28   18   18     ?Creatinine 0.70 - 1.30 mg/dL 1.22   1.30   1.47    ?Sodium 135 - 146 mmol/L 137   136   137    ?Potassium 3.5 - 5.3 mmol/L 4.4   4.1   3.9    ?Chloride 98 - 110 mmol/L 104   101   103    ?CO2 20 - 32 mmol/L 27   28   25     ?Calcium 8.6 - 10.3 mg/dL 9.9   9.4   9.1    ?Total Protein 6.1 - 8.1 g/dL 8.4   8.1   8.0    ?Total Bilirubin 0.2 - 1.2 mg/dL 0.5   0.5   0.3    ?AST 10 - 35 U/L 23   19   29     ?ALT 9 - 46 U/L 17   16   24     ? ?Lipid Panel  ?   ?Component Value Date/Time  ? CHOL 144 01/13/2021 0859  ? TRIG 186 (H) 01/13/2021 0859  ? HDL 38 (L) 01/13/2021 0859  ? CHOLHDL 3.8 01/13/2021 0859  ? CHOLHDL 5.0 (H) 07/24/2020 1424  ? [place] 75 01/13/2021 0859  ? LDLCALC 103 (H) 07/24/2020 1424  ? ? ?CBC ?   ?Component Value Date/Time  ? WBC 6.9 07/25/2021 1013  ? RBC 4.44 07/25/2021 1013  ? HGB 14.3 07/25/2021 1013  ? HCT 42.7 07/25/2021 1013  ? PLT 153 07/25/2021 1013  ? MCV 96.2 07/25/2021 1013  ? MCH 32.2 07/25/2021 1013  ? MCHC 33.5 07/25/2021  1013  ? RDW 13.3 07/25/2021 1013  ? LYMPHSABS 1,972 09/13/2020 0935  ? MONOABS 0.9 11/20/2019 0246  ? EOSABS 118 09/13/2020 0935  ? BASOSABS 31 09/13/2020 0935  ? ? ?ASSESSMENT AND PLAN: ? ?1. Type 2 diabetes melli

## 2021-10-01 ENCOUNTER — Other Ambulatory Visit: Payer: Self-pay | Admitting: Internal Medicine

## 2021-10-01 LAB — TSH: TSH: 2.7 u[IU]/mL (ref 0.450–4.500)

## 2021-10-01 MED ORDER — LEVOTHYROXINE SODIUM 50 MCG PO TABS: 75.0000 ug | ORAL_TABLET | Freq: Every day | ORAL | 2 refills | Status: DC

## 2021-10-02 ENCOUNTER — Other Ambulatory Visit: Payer: Self-pay

## 2021-10-03 ENCOUNTER — Other Ambulatory Visit: Payer: Self-pay

## 2021-11-07 ENCOUNTER — Other Ambulatory Visit: Payer: Self-pay

## 2021-11-25 ENCOUNTER — Other Ambulatory Visit: Payer: Self-pay

## 2021-12-12 ENCOUNTER — Other Ambulatory Visit: Payer: Self-pay

## 2021-12-12 ENCOUNTER — Ambulatory Visit: Payer: Self-pay

## 2021-12-26 ENCOUNTER — Other Ambulatory Visit: Payer: Self-pay

## 2021-12-31 ENCOUNTER — Other Ambulatory Visit: Payer: Self-pay

## 2021-12-31 ENCOUNTER — Encounter: Payer: Self-pay | Admitting: Internal Medicine

## 2022-01-02 ENCOUNTER — Other Ambulatory Visit: Payer: Self-pay

## 2022-01-05 ENCOUNTER — Other Ambulatory Visit: Payer: Self-pay

## 2022-01-05 ENCOUNTER — Telehealth: Payer: Self-pay

## 2022-01-05 MED ORDER — DAPAGLIFLOZIN PROPANEDIOL 10 MG PO TABS
10.0000 mg | ORAL_TABLET | Freq: Every day | ORAL | 1 refills | Status: DC
  Filled 2022-01-05 – 2022-01-06 (×2): qty 30, 30d supply, fill #0

## 2022-01-05 MED ORDER — METFORMIN HCL ER 500 MG PO TB24
ORAL_TABLET | ORAL | 1 refills | Status: DC
  Filled 2022-01-05: qty 90, 30d supply, fill #0

## 2022-01-05 NOTE — Telephone Encounter (Signed)
Invokamet is non-formulary on patient's insurance.  They will pay for the 2 components of this medication separately.  They do not prefer Invokana, but will cover [place] or Jardiance and the Metformin.

## 2022-01-05 NOTE — Telephone Encounter (Signed)
Separate components sent to our pharmacy. Can we place a pharmacist consult on these and make sure the patient is understanding of the directions? Also that he knows to stop the Invokamet.

## 2022-01-06 ENCOUNTER — Other Ambulatory Visit: Payer: Self-pay

## 2022-01-08 ENCOUNTER — Other Ambulatory Visit: Payer: Self-pay

## 2022-01-16 ENCOUNTER — Other Ambulatory Visit: Payer: Self-pay

## 2022-01-16 DIAGNOSIS — B20 Human immunodeficiency virus [HIV] disease: Secondary | ICD-10-CM

## 2022-01-16 DIAGNOSIS — Z79899 Other long term (current) drug therapy: Secondary | ICD-10-CM

## 2022-01-16 DIAGNOSIS — Z113 Encounter for screening for infections with a predominantly sexual mode of transmission: Secondary | ICD-10-CM

## 2022-01-19 LAB — COMPLETE METABOLIC PANEL WITH GFR
AG Ratio: 1 (calc) (ref 1.0–2.5)
ALT: 20 U/L (ref 9–46)
AST: 22 U/L (ref 10–35)
Albumin: 3.7 g/dL (ref 3.6–5.1)
Alkaline phosphatase (APISO): 128 U/L (ref 35–144)
BUN: 19 mg/dL (ref 7–25)
CO2: 28 mmol/L (ref 20–32)
Calcium: 9 mg/dL (ref 8.6–10.3)
Chloride: 102 mmol/L (ref 98–110)
Creat: 1.05 mg/dL (ref 0.70–1.30)
Globulin: 3.6 g/dL (calc) (ref 1.9–3.7)
Glucose, Bld: 188 mg/dL — ABNORMAL HIGH (ref 65–99)
Potassium: 3.9 mmol/L (ref 3.5–5.3)
Sodium: 137 mmol/L (ref 135–146)
Total Bilirubin: 0.5 mg/dL (ref 0.2–1.2)
Total Protein: 7.3 g/dL (ref 6.1–8.1)
eGFR: 85 mL/min/{1.73_m2} (ref >=60)

## 2022-01-19 LAB — CBC WITH DIFFERENTIAL/PLATELET
Absolute Monocytes: 495 cells/uL (ref 200–950)
Basophils Absolute: 18 cells/uL (ref 0–200)
Basophils Relative: 0.4 %
Eosinophils Absolute: 50 cells/uL (ref 15–500)
Eosinophils Relative: 1.1 %
HCT: 40.8 % (ref 38.5–50.0)
Hemoglobin: 14.4 g/dL (ref 13.2–17.1)
Lymphs Abs: 1130 cells/uL (ref 850–3900)
MCH: 32.9 pg (ref 27.0–33.0)
MCHC: 35.3 g/dL (ref 32.0–36.0)
MCV: 93.2 fL (ref 80.0–100.0)
MPV: 12.1 fL (ref 7.5–12.5)
Monocytes Relative: 11 %
Neutro Abs: 2808 cells/uL (ref 1500–7800)
Neutrophils Relative %: 62.4 %
Platelets: 134 10*3/uL — ABNORMAL LOW (ref 140–400)
RBC: 4.38 10*6/uL (ref 4.20–5.80)
RDW: 12.9 % (ref 11.0–15.0)
Total Lymphocyte: 25.1 %
WBC: 4.5 10*3/uL (ref 3.8–10.8)

## 2022-01-19 LAB — LIPID PANEL
Cholesterol: 150 mg/dL (ref <200)
HDL: 41 mg/dL (ref >=40)
LDL Cholesterol (Calc): 85 mg/dL (calc)
Non-HDL Cholesterol (Calc): 109 mg/dL (calc) (ref <130)
Total CHOL/HDL Ratio: 3.7 (calc) (ref <5.0)
Triglycerides: 142 mg/dL (ref <150)

## 2022-01-19 LAB — T-HELPER CELLS (CD4) COUNT (NOT AT ARMC)
Absolute CD4: 100 cells/uL — ABNORMAL LOW (ref 490–1740)
CD4 T Helper %: 9 % — ABNORMAL LOW (ref 30–61)
Total lymphocyte count: 1064 cells/uL (ref 850–3900)

## 2022-01-19 LAB — HIV-1 RNA QUANT-NO REFLEX-BLD
HIV 1 RNA Quant: <20 Copies/mL — ABNORMAL HIGH
HIV-1 RNA Quant, Log: <1.3 Log cps/mL — ABNORMAL HIGH

## 2022-01-19 LAB — RPR: RPR Ser Ql: NONREACTIVE

## 2022-01-20 LAB — URINE CYTOLOGY ANCILLARY ONLY
Chlamydia: NEGATIVE
Comment: NEGATIVE
Comment: NORMAL
Neisseria Gonorrhea: NEGATIVE

## 2022-01-29 ENCOUNTER — Encounter: Payer: Self-pay | Admitting: Internal Medicine

## 2022-01-29 ENCOUNTER — Ambulatory Visit (INDEPENDENT_AMBULATORY_CARE_PROVIDER_SITE_OTHER): Payer: Self-pay | Admitting: Internal Medicine

## 2022-01-29 ENCOUNTER — Other Ambulatory Visit: Payer: Self-pay

## 2022-01-29 VITALS — BP 131/75 | HR 69 | Resp 16 | Ht 61.5 in | Wt 132.0 lb

## 2022-01-29 DIAGNOSIS — B20 Human immunodeficiency virus [HIV] disease: Secondary | ICD-10-CM

## 2022-01-29 NOTE — Patient Instructions (Addendum)
Continue your current hiv medication: Dovato -- one tablet once a day Bactrim -- one tablet three times a week   Ask your primary care doctor to refer you to colonoscopy for colon cancer screening   See me in 6 months; will do blood tests after visit

## 2022-01-29 NOTE — Progress Notes (Signed)
Chief complaint: f/u pulm TB and hiv care  Subjective:    Patient ID: Manuel Hayes, male    DOB: [birth year], 51 y.o.   MRN: [id number]  Cc: hiv care  HPI 51 yo hispanic speaking only male, dm2, hlp recently dx'ed with HIV and pulm TB on antituberculous medication, here for f/u  Hx is via him/Spanish interpreter     Background: ----------------- He was seen/established care with dr Tommy Medal initially at [facility](or at least to get on ART) -TB regimen started almost 2 months. He was changed from rifampin to rifabutin for preparation of starting ART.  -he doesn't know how he got hiv. No hx ivdu. Heterosexual. Prior married in [place]. 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 He has labs monitored along with recent sputum  He still has some dry mouth. He denies headache, visual blurriness, eye pain, visual loss  He is eating well and gaining weight  No n/v/rash, joint pain, diarrhea.   dovato started 10/14  meds reviewed: Dovato His home dm2 meds TB meds   04/02/20 id f/u Doing well, gaining weight (110 --> 130 pounds) Feeling well, no complaint Cough much improved Not seeing his PCP at [facility] clinic as afraid he can't afford medications. Off and on lovastatin, metformin, glipizide, and lisinopril and levothyroxine Taking dovato; not taking bactrim Still getting DOT for tb meds. Finished with intensive therapy 2 months on 10/30. He is on b6, rifabutin, and inh. No side effect   12/16 id f/u HIV no missed dose pulm tb -- on DOT. No concern. He doesn't know when his last sputum testing was Patient still haven't seen his pcp at [facility] clinic (financial) He brings his pill box today dovato Bactrim Nystatin  liquid Lisinopril Metformin Valacyclovir (he is supposed to be on synthroid, lovastatin, and glipizide as well); last sugar check was very high He in the mean time developed: 1) right eye pain/redness 3 days; no f/c or decreased vision. He has an eye doctor next visit 05/2020. He is on tb treatment on continuation phase now. His last cxr 03/2020 showed scarring left lower lobe. No f/c/nightsweat/cough/weight loss. Previously has some blurry vision but initial ophthalmologic visit no concern for infection; serologic w/u syphilis/toxo/cmv negative. No penile rash/discharge 2) recently seen by urgent care for right thigh shingles given valacyclovir; has 1 more pill to take. Lesions had scabbed over; pain much better  Labs to be done today   05/31/2020 id f/u He was referred and finally saw internal medicine clinic 1/07 [facility] for primary care He returns today for tb/hiv care [place] had resolved Never saw ophthalmology (awaiting financial aid), however eye redness/discomfort resolved No f/c/n/v/diarrhea No cough Reviewed labs. Platelet has been at just above 50 Getting DOT for tb tx Compliant no missing dose of dovato  07/24/20 id f/u Complain of bilateral hand pain in the ip joints for about 1-2 month and also ankle/toes. These are most pronounced in the heels and the fingers. No wrist, knees, elbows or back pain. Some shoulder pain No skin rash No fever, chill Pain stable, 8/10. Constant daily.  No swelling Sx improves as the day goes along. Sx worst in the morning, and after prolonged rest including stiffness No family hx rheumatoid arthritis or lupus or autoimmune  disease No fatigue, oral/genital ulcer, new visual changes/blurriness/pain No dysuria/penile discharge   08/23/20 id f/u Patient is back here today for ongoing joint pain (wrist/hands, hip, knees/heels, elbows) Finished TB treatment already on 4/06 No fever, chill Good/normal energy level No  depression/insomnia/anxiety We review labs from last visit --> crp, lft normal; wbc normal; platelet at 100s He is on bid tivicay and daily descovy right now due to tb meds    10/02/20 Doing well Bilateral hands pain chronic No f/c/n/v/diarrhea Takes dovato everyday no missed dose  Hx via language interpreter   01/03/2021 Hx via language interpreter Patient without complaint No f/c/nightsweat/weight loss/decreased appetite His joint pain hurts a lot less now in hands and left shoulder. It hurts more at night not particularly at rest.  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 Renting a room from a friend No sexual relationship or encounter Wife in [place], haven't met in more than 5 years; he sends money to her   07/25/2021 id clinic follow up Tolerating dovato Michela Pitcher he is taking 2 medications prescribed by [place] and other medictions from medicine clinic He is adamant that he is not missing any dose of dovato He takes it before his food (breakfast) He doesn't take any vitamins/minerals rx'ed or over the counter Labs done 07/15/21 reviewed Lab Results  Component Value Date   CD4TCELL 9 (L) 01/16/2022   CD4TABS 129 (L) 07/24/2020  Cd4 105 (stagnant around 7-8% the last 2 years) Rpr nonreactive; hep c ab and hep b sag/core ab/sAb nonreactive Hiv rna 32; one time undetected 10/02/20 and 07/24/20 other than that low level viremia but <100 since 04/2020 (tx started 01/2020)  Good appetite No weight loss No cough; no night sweat/f/c  Denies sexual encounter since (although he was taking condoms with him). He wants std screen. He has sex with women only   01/29/22 id clinic f/u Social: working painting for house renovation; lives with a roommate; no smoking/street drug use; not sure when last sexual encounter was but denies currently being sexually  active  No missed dose dovato the last 4 weeks. Continues on bactrim prophy  Sees internal medicine for his htn/dm2/hypothyroidism  He has no complaint or health concern today     ROS: All other ros negative   Past Medical History:  Diagnosis Date   HIV disease ([place]) 02/08/2020   Hyperlipidemia    Pulmonary nodule    Pulmonary tuberculosis 02/08/2020    No past surgical history on file.  Family History  Problem Relation Age of Onset   Diabetes Mother    Diabetes Brother       Social History   Socioeconomic History   Marital status: Single    Spouse name: Not on file   Number of children: Not on file   Years of education: Not on file   Highest education level: Not on file  Occupational History   Not on file  Tobacco Use   Smoking status: Never   Smokeless tobacco: Never  Substance and Sexual Activity   Alcohol use: Not Currently   Drug use: Never   Sexual activity: Yes    Partners: Female    Comment: accepted condoms  Other Topics Concern   Not on file  Social History Narrative   Not on file   Social Determinants of Health  Financial Resource Strain: Not on file  Food Insecurity: Not on file  Transportation Needs: Not on file  Physical Activity: Not on file  Stress: Not on file  Social Connections: Not on file    No Known Allergies   Current Outpatient Medications:    Alcohol Swabs PADS, Clean fingers/equipments prior to CBG testing of your fingers, Disp: 100 each, Rfl: 11   atorvastatin (LIPITOR) 10 MG tablet, Take 1 tablet (10 mg total) by mouth daily., Disp: 90 tablet, Rfl: 3   blood glucose meter kit and supplies KIT, Dispense based on patient and insurance preference. Use up to four times daily as directed. (FOR ICD-9 250.00, 250.01)., Disp: 1 each, Rfl: 0   dapagliflozin propanediol (FARXIGA) 10 MG TABS tablet, Take 1 tablet (10 mg total) by mouth daily., Disp: 30 tablet, Rfl: 1   DOVATO 50-300 MG tablet, TAKE 1 TABLET BY MOUTH DAILY,  Disp: 30 tablet, Rfl: 6   hydrocortisone (ANUSOL-HC) 2.5 % rectal cream, , Disp: , Rfl:    levothyroxine (SYNTHROID) 50 MCG tablet, Take 1.5 tablets (75 mcg total) by mouth daily before breakfast., Disp: 45 tablet, Rfl: 2   lisinopril (ZESTRIL) 10 MG tablet, Take 1 tablet (10 mg total) by mouth daily., Disp: 90 tablet, Rfl: 2   metFORMIN (GLUCOPHAGE-XR) 500 MG 24 hr tablet, Take 2 tablets (1023m) in the morning with food and 1 tablet (5039m in the evening with food., Disp: 90 tablet, Rfl: 1   Omega-3 Fatty Acids (FISH OIL PO), Take by mouth., Disp: , Rfl:    sulfamethoxazole-trimethoprim (BACTRIM DS) 800-160 MG tablet, TAKE 1 TABLET BY MOUTH 3 TIMES A WEEK, Disp: 36 tablet, Rfl: 2   gabapentin (NEURONTIN) 100 MG capsule, Take 1 capsule (100 mg total) by mouth 3 (three) times daily. (Patient not taking: Reported on 05/02/2020), Disp: 90 capsule, Rfl: 2   glucose blood test strip, USE 1 TO CHECK BLOOD GLUCOSE UP TO 7 TIMES A WEEK. (USELO PARA CONTROLAR EL NIVEL DE AZUCAR EN LA SANGRE [place]STA 7 VECES A LA [place] (Patient taking differently: USE 1 TO CHECK BLOOD GLUCOSE UP TO 7 TIMES A WEEK. (USELO PARA CONTROLAR EL NIVEL DE AZUCAR EN LA SANGRE HASTA 7 VECES A LA [place]), Disp: 100 strip, Rfl: 3   Microlet Lancets MISC, USE 1 UP TO 7 TIMES A WEEK. (USELO PARA CONTROLAR EL NIVEL DE AZUCAR EN LA SANGRE SEGUN LAS INSTRUCCIONES HASTA 7 VECES A LA [place] Disp: 100 each, Rfl: 3      Objective:    Vitals reviewed Vitals:   01/29/22 0949  BP: 131/75  Pulse: 69  Resp: 16  SpO2: 98%    General/constitutional: no distress, pleasant HEENT: Normocephalic, PER, Conj Clear, EOMI, Oropharynx clear Neck supple CV: rrr no mrg Lungs: clear to auscultation, normal respiratory effort Abd: Soft, Nontender Ext: no edema Skin: No Rash Neuro: nonfocal MSK: no peripheral joint swelling/tenderness/warmth; back spines nontender       Labs: Lab Results   Component Value Date   WBC 4.5 01/16/2022   HGB 14.4 01/16/2022   HCT 40.8 01/16/2022   MCV 93.2 01/16/2022   PLT 134 (L) 0976/81/1572 Last metabolic panel Lab Results  Component Value Date   GLUCOSE 188 (H) 01/16/2022   NA 137 01/16/2022   K 3.9 01/16/2022   CL 102 01/16/2022   CO2 28 01/16/2022   BUN 19 01/16/2022   CREATININE 1.05 01/16/2022   GFRNONAA 63 07/24/2020   GFRAA  73 07/24/2020   CALCIUM 9.0 01/16/2022   PROT 7.3 01/16/2022   ALBUMIN 2.2 (L) 11/20/2019   BILITOT 0.5 01/16/2022   ALKPHOS 331 (H) 11/20/2019   AST 22 01/16/2022   ALT 20 01/16/2022   ANIONGAP 6 11/20/2019    3/09 cr 1.3; lft normal; cbc 4/13/103; tsh 4.43; a1c 6.3; ana negative; rf/ccp negative 3/09 lipid 180; 36; 103; 310  Serology: 06/2021 rpr negative; hepatis acute panel nr  01/2020 toxoplasma IgG positive; hep b sAg/sAb/cAb NR; hep C ab negative; Hep A ab reactive; cmv IgM and hsv1&2 IgM negative; vzv igg positive 01/2020 urine gc/chlam pcr negative 01/2020 rpr negative  hiv labs        cd4 (%)  /  VL 9/23              50 (3%) / 197k 11/16                          / 373 12/16           166 (6)    /  66 05/31/20                     /  25 07/2020        129 (8)   /  <20 04/29                          /  47 05/18                          /  undetectable 12/19/20                     /  50s 06/2021        107 (7)  /   30s 01/2022        100 (9)  /   <20  Imaging: 11/16 chest xray left lung scarring  08/21/20 chest xray Redemonstration of nodularity of the left lung base, unchanged from prior chest x-ray and CT, with no evidence of acute cardiopulmonary disease      Assessment & Plan:    #Pulmonary TB: #thrombocytopenia, meds induced Started ATB tx first week of 01/2020. Finished intensive tx 10/29.  AFB sputum surveillance ngative on 9/5, 6, 19, 20, 10/3, 4, 17, 18.  03/2020 cxr residual scarring LLL, no cavity Developed thrombocytopenia into 50s, thought to be related to  rifampin, but improved Finished tb meds 4/6. Platelet recovered while on tb meds  -No sign of active infection/relapse as of 01/2022   #AIDS/HIV disease: started on dovato 02/29/2020. No prior therapy (new dx and referred to our clinic). No evidence Hepatitis B. Genotype 02/08/2020 no RTI or PI resistance (mutations -- RT a98S, v118I, R211k; PR I13v, d60e, l63p, v77i). He is doing very well on Dovato, and very compliant. Viral load undetectable 07/2020  Blips still. Report compliance. No obvious ddi. Not taking vitamins/minerals  This visit viral load undetectable on dovato; nothing different he does since last visit  Cd4 % still improving but slowly     -discussed u=u -encourage compliance -continue current HIV medication -labs 6 months after clinic visit    #hands pain #left shoulder pain Resolved as of 07/25/2021 Thought to be related to tb meds previously   [facility] -vaccination/hepatitis b  No hx hep b exposure/vaccination Cd4 still <200, so will defer  getting vaccine  -std screening 01/2022 rpr and urine gc/chlamydia negative -hx tb. No b sx -cancer screening Advise patient to discuss with pcp to get colonoscopy for screening colorectal cancer

## 2022-01-30 ENCOUNTER — Ambulatory Visit: Payer: Self-pay | Admitting: Internal Medicine

## 2022-02-12 ENCOUNTER — Ambulatory Visit: Payer: Self-pay | Attending: Internal Medicine | Admitting: Internal Medicine

## 2022-02-12 ENCOUNTER — Other Ambulatory Visit: Payer: Self-pay

## 2022-02-12 ENCOUNTER — Other Ambulatory Visit: Payer: Self-pay | Admitting: Internal Medicine

## 2022-02-12 ENCOUNTER — Encounter: Payer: Self-pay | Admitting: Internal Medicine

## 2022-02-12 VITALS — BP 138/81 | HR 81 | Temp 98.2°F | Ht 61.0 in | Wt 131.2 lb

## 2022-02-12 DIAGNOSIS — R809 Proteinuria, unspecified: Secondary | ICD-10-CM

## 2022-02-12 DIAGNOSIS — E039 Hypothyroidism, unspecified: Secondary | ICD-10-CM

## 2022-02-12 DIAGNOSIS — E1159 Type 2 diabetes mellitus with other circulatory complications: Secondary | ICD-10-CM

## 2022-02-12 DIAGNOSIS — Z1211 Encounter for screening for malignant neoplasm of colon: Secondary | ICD-10-CM

## 2022-02-12 DIAGNOSIS — J069 Acute upper respiratory infection, unspecified: Secondary | ICD-10-CM

## 2022-02-12 DIAGNOSIS — E1169 Type 2 diabetes mellitus with other specified complication: Secondary | ICD-10-CM

## 2022-02-12 DIAGNOSIS — I152 Hypertension secondary to endocrine disorders: Secondary | ICD-10-CM

## 2022-02-12 DIAGNOSIS — E1165 Type 2 diabetes mellitus with hyperglycemia: Secondary | ICD-10-CM

## 2022-02-12 DIAGNOSIS — E785 Hyperlipidemia, unspecified: Secondary | ICD-10-CM

## 2022-02-12 DIAGNOSIS — E1129 Type 2 diabetes mellitus with other diabetic kidney complication: Secondary | ICD-10-CM

## 2022-02-12 DIAGNOSIS — Z23 Encounter for immunization: Secondary | ICD-10-CM

## 2022-02-12 LAB — POCT GLYCOSYLATED HEMOGLOBIN (HGB A1C): HbA1c, POC (controlled diabetic range): 12.1 % — AB (ref 0.0–7.0)

## 2022-02-12 LAB — GLUCOSE, POCT (MANUAL RESULT ENTRY): POC Glucose: 202 mg/dl — AB (ref 70–99)

## 2022-02-12 MED ORDER — BENZONATATE 100 MG PO CAPS
100.0000 mg | ORAL_CAPSULE | Freq: Two times a day (BID) | ORAL | 0 refills | Status: DC | PRN
  Filled 2022-02-12: qty 20, 10d supply, fill #0

## 2022-02-12 MED ORDER — LISINOPRIL 20 MG PO TABS
20.0000 mg | ORAL_TABLET | Freq: Every day | ORAL | 1 refills | Status: DC
  Filled 2022-02-12: qty 30, 30d supply, fill #0

## 2022-02-12 NOTE — Progress Notes (Signed)
Patient ID: Manuel Hayes, male    DOB: Sep 25, 1970  MRN: [id number]  CC: Diabetes (Diabetes f/u. No questions/ concerns)   Subjective: Manuel Hayes is a 51 y.o. 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    P t has meds with him today.  DM: Results for orders placed or performed in visit on 02/12/22  POCT glucose (manual entry)  Result Value Ref Range   POC Glucose 202 (A) 70 - 99 mg/dl  POCT glycosylated hemoglobin (Hb A1C)  Result Value Ref Range   Hemoglobin A1C     HbA1c POC (<> result, manual entry)     HbA1c, POC (prediabetic range)     HbA1c, POC (controlled diabetic range) 12.1 (A) 0.0 - 7.0 %    Last A1c was 7.5.  We added metformin XR 500 mg daily to Invokamet 50/1000 mg daily.  Since then, invokamet was nonformulary for his insurance so medication changed to Farxiga 10 mg daily and Metformin XR 1000 mg a.m/500 mg p.m Compliant with medications. Blood sugar readings: checks  BS daily before BF.  Usually 120-150 but lately it has been higher.  This a.m was 270.  This past wk it has been 200-230.  Thinks due to symptoms of flu he is having currently.  C/O cough productive of green phlegm x 3 days.  No SOB, fever, chest or nasal congestion, HA, body aches.  Had co-work dx with flu 1 wk ago.  No one in house sick Eating habits: eats mainly at home; does not eat out Has appt later today at [facility] for eye exam   HTN: Reports compliance with lisinopril 10 mg and low-salt diet.  Took med already.  No device to check BP  HL:  taking and tolerating Lipitor 10 mg daily  Hypothyroid: Reports compliance with levothyroxine 50 mcg.  Last TSH 4 months ago was 2.7.  No feeling of being hot or cold all the time  HIV: Followed by Dr. Gale Journey Reports compliance with taking his Dovato and Bactrim.  Last  check  HM:  did not get FIT  Patient Active Problem List   Diagnosis Date Noted   Need for tetanus, diphtheria, and acellular pertussis (Tdap) vaccine 09/06/2020   Hypertension associated with diabetes ([place]) 09/06/2020   Acquired hypothyroidism 09/06/2020   Herpes zoster 04/22/2020   Pulmonary tuberculosis 02/08/2020   HIV disease ([place]) 02/08/2020   Lung nodules 01/04/2020   Pruritic rash 03/16/2018   Anemia 12/27/2017   Hyperproteinemia 07/05/2017   Hypoalbuminemia 07/05/2017   Positive for macroalbuminuria 11/21/2016   Elevated liver enzymes 08/21/2016   Benign essential hypertension 08/21/2016   Hyperlipidemia associated with type 2 diabetes mellitus ([place]) 08/21/2016   Type 2 diabetes mellitus ([place]) 08/21/2016   Blurring of visual image 08/21/2016     Current Outpatient Medications on File Prior to Visit  Medication Sig Dispense Refill   Alcohol Swabs PADS Clean fingers/equipments prior to CBG testing of your fingers 100 each 11   atorvastatin (LIPITOR) 10 MG tablet Take 1 tablet (10 mg total) by mouth daily. 90 tablet 3   blood glucose meter kit and supplies KIT Dispense based on patient and insurance preference. Use up to four times daily as directed. (FOR ICD-9 250.00, 250.01). 1 each 0   dapagliflozin propanediol (FARXIGA) 10 MG TABS tablet Take 1 tablet (10 mg  total) by mouth daily. 30 tablet 1   DOVATO 50-300 MG tablet TAKE 1 TABLET BY MOUTH DAILY 30 tablet 6   gabapentin (NEURONTIN) 100 MG capsule Take 1 capsule (100 mg total) by mouth 3 (three) times daily. (Patient not taking: Reported on 05/02/2020) 90 capsule 2   glucose blood test strip USE 1 TO CHECK BLOOD GLUCOSE UP TO 7 TIMES A WEEK. (USELO PARA CONTROLAR EL NIVEL DE AZUCAR EN LA SANGRE HASTA 7 VECES A LA [place]). (Patient taking differently: USE 1 TO CHECK BLOOD GLUCOSE UP TO 7 TIMES A WEEK. (USELO PARA CONTROLAR EL NIVEL DE AZUCAR EN LA SANGRE HASTA 7 VECES A LA [place]).) 100 strip 3   hydrocortisone (ANUSOL-HC) 2.5 % rectal cream      levothyroxine (SYNTHROID) 50 MCG tablet Take 1.5 tablets (75 mcg total) by mouth daily before breakfast. 45 tablet 2   metFORMIN (GLUCOPHAGE-XR) 500 MG 24 hr tablet Take 2 tablets (1035m) in the morning with food and 1 tablet (5048m in the evening with food. 90 tablet 1   Microlet Lancets MISC USE 1 UP TO 7 TIMES A WEEK. (USELO PARA CONTROLAR EL NIVEL DE AZUCAR EN LA SANGRE SEGUN LAS INSTRUCCIONES HASTA 7 VECES A LA [place] each 3   Omega-3 Fatty Acids (FISH OIL PO) Take by mouth.     sulfamethoxazole-trimethoprim (BACTRIM DS) 800-160 MG tablet TAKE 1 TABLET BY MOUTH 3 TIMES A WEEK 36 tablet 2   No current facility-administered medications on file prior to visit.    No Known Allergies  Social History   Socioeconomic History   Marital status: Single    Spouse name: Not on file   Number of children: Not on file   Years of education: Not on file   Highest education level: Not on file  Occupational History   Not on file  Tobacco Use   Smoking status: Never   Smokeless tobacco: Never  Substance and Sexual Activity   Alcohol use: Not Currently   Drug use: Never   Sexual activity: Yes    Partners: Female    Comment: accepted condoms  Other Topics Concern   Not on file  Social History Narrative   Not on file   Social Determinants of Health   Financial Resource Strain: Not on file  Food Insecurity: Not on file  Transportation Needs: Not on file  Physical Activity: Not on file  Stress: Not on file  Social Connections: Not on file  Intimate Partner Violence: Not on file    Family History  Problem Relation Age of Onset   Diabetes Mother    Diabetes Brother     History reviewed. No pertinent surgical history.  ROS: Review of Systems Negative except as stated above  PHYSICAL EXAM: BP 138/81   Pulse 81   Temp 98.2 F (36.8 C) (Oral)   Ht _0  (1.549 m)   Wt 131 lb 3.2 oz (59.5  kg)   SpO2 98%   BMI 24.79 kg/m   Wt Readings from Last 3 Encounters:  02/12/22 131 lb 3.2 oz (59.5 kg)  01/29/22 132 lb (59.9 kg)  09/30/21 134 lb (60.8 kg)    Physical Exam  General appearance - alert, well appearing, middle-aged Hispanic male and in no distress.  Mild audible congestion Mental status - normal mood, behavior, speech, dress, motor activity, and thought processes Mouth - mucous membranes moist, pharynx normal without lesions Neck - supple, no significant adenopathy Chest -  clear to auscultation, no wheezes, rales or rhonchi, symmetric air entry Heart - normal rate, regular rhythm, normal S1, S2, no murmurs, rubs, clicks or gallops Extremities - peripheral pulses normal, no pedal edema, no clubbing or cyanosis      Latest Ref Rng & Units 01/16/2022    9:34 AM 07/25/2021   10:13 AM 07/15/2021    9:08 AM  CMP  Glucose 65 - 99 mg/dL 188  139  138   BUN 7 - 25 mg/dL _0 Creatinine 0.70 - 1.30 mg/dL 1.05  1.22  1.30   Sodium 135 - 146 mmol/L 137  137  136   Potassium 3.5 - 5.3 mmol/L 3.9  4.4  4.1   Chloride 98 - 110 mmol/L 102  104  101   CO2 20 - 32 mmol/L _1 Calcium 8.6 - 10.3 mg/dL 9.0  9.9  9.4   Total Protein 6.1 - 8.1 g/dL 7.3  8.4  8.1   Total Bilirubin 0.2 - 1.2 mg/dL 0.5  0.5  0.5   AST 10 - 35 U/L _2 ALT 9 - 46 U/L _3 Lipid Panel     Component Value Date/Time   CHOL 150 01/16/2022 0934   CHOL 144 01/13/2021 0859   TRIG 142 01/16/2022 0934   HDL 41 01/16/2022 0934   HDL 38 (L) 01/13/2021 0859   CHOLHDL 3.7 01/16/2022 0934   LDLCALC 85 01/16/2022 0934    CBC    Component Value Date/Time   WBC 4.5 01/16/2022 0934   RBC 4.38 01/16/2022 0934   HGB 14.4 01/16/2022 0934   HCT 40.8 01/16/2022 0934   PLT 134 (L) 01/16/2022 0934   MCV 93.2 01/16/2022 0934   MCH 32.9 01/16/2022 0934   MCHC 35.3 01/16/2022 0934   RDW 12.9 01/16/2022 0934   LYMPHSABS 1,130 01/16/2022 0934   MONOABS 0.9 11/20/2019 0246    EOSABS 50 01/16/2022 0934   BASOSABS 18 01/16/2022 0934    ASSESSMENT AND PLAN:  1. Type 2 diabetes mellitus with hyperglycemia, without long-term current use of insulin ([place]) Patient's diabetes is not controlled despite compliance with taking metformin and Farxiga. [place] working with me today, Leamon Arnt, accidentally discharged the patient before I received his A1c.  We did track him down in our pharmacy on the first floor.  We have requested that he return to the clinic after he picks up medication but patient never came back.  I also tried calling the patient using [place] Elita Quick [phone] on his cell phone but patient did not answer.  I left a voicemail message asking him to call us back to discuss management of his diabetes.  I plan to add evening dose of Lantus insulin.  We will try to reach him again this evening. - POCT glucose (manual entry) - POCT glycosylated hemoglobin (Hb A1C) - Microalbumin / creatinine urine ratio  2. Hypertension associated with diabetes ([place]) Not at goal.  Increase lisinopril to 20 mg daily. - lisinopril (ZESTRIL) 20 MG tablet; Take 1 tablet (20 mg total) by mouth daily.  Dispense: 90 tablet; Refill: 1  3. Hyperlipidemia associated with type 2 diabetes mellitus (HCC) Continue atorvastatin 10 mg daily.  4. Acquired hypothyroidism Doing well on levothyroxine with normal TSH level 4 months ago.  Continue levothyroxine 75 mcg daily.  5. Upper respiratory infection, viral Recommend using Coricidin HBP over-the-counter  to help with congestion.  We will do COVID-19 test. - benzonatate (TESSALON) 100 MG capsule; Take 1 capsule (100 mg total) by mouth 2 (two) times daily as needed for cough.  Dispense: 20 capsule; Refill: 0 - Novel Coronavirus, NAA ([facility])  6. Screening for colon cancer - Fecal occult blood, imunochemical([facility])  7. Need for immunization against influenza - Flu Vaccine QUAD 54moIM (Fluarix, Fluzone & Alfiuria Quad  PF)   AMN Language interpreter used during this encounter. #Diego [phone] Patient was given the opportunity to ask questions.  Patient verbalized understanding of the plan and was able to repeat key elements of the plan.   This documentation was completed using DRadio producer  Any transcriptional errors are unintentional.  Orders Placed This Encounter  Procedures   Novel Coronavirus, NAA ([facility])   Fecal occult blood, imunochemical([facility])   Flu Vaccine QUAD 668moM (Fluarix, Fluzone & Alfiuria Quad PF)   Microalbumin / creatinine urine ratio   POCT glucose (manual entry)   POCT glycosylated hemoglobin (Hb A1C)     Requested Prescriptions   Signed Prescriptions Disp Refills   benzonatate (TESSALON) 100 MG capsule 20 capsule 0    Sig: Take 1 capsule (100 mg total) by mouth 2 (two) times daily as needed for cough.   lisinopril (ZESTRIL) 20 MG tablet 90 tablet 1    Sig: Take 1 tablet (20 mg total) by mouth daily.    Return in about 4 months (around 06/14/2022).  DeKarle PlumberMD, FACP

## 2022-02-13 ENCOUNTER — Other Ambulatory Visit: Payer: Self-pay

## 2022-02-13 LAB — MICROALBUMIN / CREATININE URINE RATIO
Creatinine, Urine: 54.5 mg/dL
Microalb/Creat Ratio: 295 mg/g creat — ABNORMAL HIGH (ref 0–29)
Microalbumin, Urine: 160.8 ug/mL

## 2022-02-13 LAB — NOVEL CORONAVIRUS, NAA: SARS-CoV-2, NAA: NOT DETECTED

## 2022-02-18 ENCOUNTER — Telehealth: Payer: Self-pay | Admitting: Internal Medicine

## 2022-02-18 NOTE — Telephone Encounter (Signed)
-----   Message from Ladell Pier, MD sent at 02/13/2022  9:22 PM EDT ----- Regarding: Call pt about elev A1C

## 2022-02-18 NOTE — Telephone Encounter (Signed)
Pt informed of note per pcp to schedule appt to discuss A1C results. Pt expressed understanding appt scheduled Tue 10/31@9 :10am to discuss elevated A1C.[name] Kitchen------DD,RMA

## 2022-03-04 ENCOUNTER — Other Ambulatory Visit: Payer: Self-pay | Admitting: Internal Medicine

## 2022-03-04 DIAGNOSIS — B20 Human immunodeficiency virus [HIV] disease: Secondary | ICD-10-CM

## 2022-03-04 MED ORDER — SULFAMETHOXAZOLE-TRIMETHOPRIM 800-160 MG PO TABS: ORAL_TABLET | ORAL | 2 refills | Status: DC

## 2022-03-17 ENCOUNTER — Other Ambulatory Visit: Payer: Self-pay

## 2022-03-17 ENCOUNTER — Other Ambulatory Visit: Payer: Self-pay | Admitting: Pharmacist

## 2022-03-17 ENCOUNTER — Ambulatory Visit: Payer: Self-pay | Attending: Internal Medicine | Admitting: Internal Medicine

## 2022-03-17 ENCOUNTER — Encounter: Payer: Self-pay | Admitting: Internal Medicine

## 2022-03-17 VITALS — BP 119/73 | HR 66 | Ht 61.5 in | Wt 132.8 lb

## 2022-03-17 DIAGNOSIS — E1165 Type 2 diabetes mellitus with hyperglycemia: Secondary | ICD-10-CM

## 2022-03-17 DIAGNOSIS — Z1211 Encounter for screening for malignant neoplasm of colon: Secondary | ICD-10-CM

## 2022-03-17 LAB — GLUCOSE, POCT (MANUAL RESULT ENTRY): POC Glucose: 155 mg/dl — AB (ref 70–99)

## 2022-03-17 MED ORDER — METFORMIN HCL ER 500 MG PO TB24
ORAL_TABLET | ORAL | 2 refills | Status: DC
  Filled 2022-03-17: qty 120, 30d supply, fill #0

## 2022-03-17 MED ORDER — METFORMIN HCL ER 500 MG PO TB24
1000.0000 mg | ORAL_TABLET | Freq: Two times a day (BID) | ORAL | 3 refills | Status: DC
  Filled 2022-03-17: qty 120, 30d supply, fill #0

## 2022-03-17 NOTE — Progress Notes (Signed)
Patient ID: Manuel Hayes, male    DOB: [birth year]  MRN: [id number]  CC: Diabetes   Subjective: Manuel Hayes is a 51 y.o. male who presents for f/u DM.  Manuel Hayes from [facility], is with him and interprets.  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   DM: Results for orders placed or performed in visit on 03/17/22  POCT glucose (manual entry)  Result Value Ref Range   POC Glucose 155 (A) 70 - 99 mg/dl   Lab Results  Component Value Date   HGBA1C 12.1 (A) 02/12/2022  Pt's AIC was significantly elev on last visit compared to prior.  Patient was released prior to me addressing his A1c.  We tried to contact him via phone that day without success.  Since then he reports his blood sugars have improved. Checks BS Q a.m before BF - gives range 120-150.  This a.m before BF was 190.  Yesterday it was 160.  No BS >200 lately Suppose to be on  Metformin XR 532m 2 a.m and 1 p.m and Farxiga 10 mg.  Taking Metformin ER only 500 mg BID Denies polyuria/polydipsia Cooks at home to avoid eating out.  Drinks mainly water; no sodas or juices. Walks for exercise on days off work.  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  Told he has cataracts and was rxn glasses. 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/08/2020   HIV disease ([place] 02/08/2020   Lung nodules 01/04/2020   Pruritic rash 03/16/2018   Anemia 12/27/2017   Hyperproteinemia 07/05/2017   Hypoalbuminemia 07/05/2017   Positive for  macroalbuminuria 11/21/2016   Elevated liver enzymes 08/21/2016   Benign essential hypertension 08/21/2016   Hyperlipidemia associated with type 2 diabetes mellitus ([place] 08/21/2016   Type 2 diabetes mellitus ([place] 08/21/2016   Blurring of visual image 08/21/2016     Current Outpatient Medications on File Prior to Visit  Medication Sig Dispense Refill   Alcohol Swabs PADS Clean fingers/equipments prior to CBG testing of your fingers 100 each 11   atorvastatin (LIPITOR) 10 MG tablet Take 1 tablet (10 mg total) by mouth daily. 90 tablet 3   benzonatate (TESSALON) 100 MG capsule Take 1 capsule (100 mg total) by mouth 2 (two) times daily as needed for cough. 20 capsule 0   blood glucose meter kit and supplies KIT Dispense based on patient and insurance preference. Use up to four times daily as directed. (FOR ICD-9 250.00, 250.01). 1 each 0   dapagliflozin propanediol (FARXIGA) 10 MG TABS tablet Take 1 tablet (10 mg total) by mouth daily. 30 tablet 1   DOVATO 50-300 MG tablet TAKE 1 TABLET BY MOUTH DAILY 30 tablet 6   hydrocortisone (ANUSOL-HC) 2.5 % rectal cream      levothyroxine (SYNTHROID) 50 MCG tablet Take 1.5 tablets (75 mcg total) by mouth daily before breakfast. 45 tablet 2   lisinopril (ZESTRIL) 20 MG tablet Take 1 tablet (20 mg total) by mouth daily.  90 tablet 1   Omega-3 Fatty Acids (FISH OIL PO) Take by mouth.     sulfamethoxazole-trimethoprim (BACTRIM DS) 800-160 MG tablet TAKE 1 TABLET BY MOUTH 3 TIMES A WEEK 36 tablet 2   gabapentin (NEURONTIN) 100 MG capsule Take 1 capsule (100 mg total) by mouth 3 (three) times daily. (Patient not taking: Reported on 05/02/2020) 90 capsule 2   glucose blood test strip USE 1 TO CHECK BLOOD GLUCOSE UP TO 7 TIMES A WEEK. (USELO PARA CONTROLAR EL NIVEL DE AZUCAR EN LA SANGRE HASTA 7 VECES A LA [place]). (Patient taking differently: USE 1 TO CHECK BLOOD GLUCOSE UP TO 7 TIMES A WEEK. (USELO PARA CONTROLAR EL NIVEL DE AZUCAR EN LA  SANGRE HASTA 7 VECES A LA [place]).) 100 strip 3   Microlet Lancets MISC USE 1 UP TO 7 TIMES A WEEK. (USELO PARA CONTROLAR EL NIVEL DE AZUCAR EN LA SANGRE SEGUN LAS INSTRUCCIONES HASTA 7 VECES A LA [place]. 100 each 3   No current facility-administered medications on file prior to visit.    No Known Allergies  Social History   Socioeconomic History   Marital status: Single    Spouse name: Not on file   Number of children: Not on file   Years of education: Not on file   Highest education level: Not on file  Occupational History   Not on file  Tobacco Use   Smoking status: Never   Smokeless tobacco: Never  Substance and Sexual Activity   Alcohol use: Not Currently   Drug use: Never   Sexual activity: Yes    Partners: Female    Comment: accepted condoms  Other Topics Concern   Not on file  Social History Narrative   Not on file   Social Determinants of Health   Financial Resource Strain: Not on file  Food Insecurity: Not on file  Transportation Needs: Not on file  Physical Activity: Not on file  Stress: Not on file  Social Connections: Not on file  Intimate Partner Violence: Not on file    Family History  Problem Relation Age of Onset   Diabetes Mother    Diabetes Brother     No past surgical history on file.  ROS: Review of Systems Negative except as stated above  PHYSICAL EXAM: BP 119/73 (BP Location: Right Arm, Patient Position: Sitting, Cuff Size: Normal)   Pulse 66   Ht 5' 1.5" (1.562 m)   Wt 132 lb 12.8 oz (60.2 kg)   SpO2 100%   BMI 24.69 kg/m   Wt Readings from Last 3 Encounters:  03/17/22 132 lb 12.8 oz (60.2 kg)  02/12/22 131 lb 3.2 oz (59.5 kg)  01/29/22 132 lb (59.9 kg)    Physical Exam   General appearance - alert, well appearing, and in no distress Mental status - normal mood, behavior, speech, dress, motor activity, and thought processes Chest - clear to auscultation, no wheezes, rales or rhonchi, symmetric  air entry Heart - normal rate, regular rhythm, normal S1, S2, no murmurs, rubs, clicks or gallops     Latest Ref Rng & Units 01/16/2022    9:34 AM 07/25/2021   10:13 AM 07/15/2021    9:08 AM  CMP  Glucose 65 - 99 mg/dL 188  139  138   BUN 7 - 25 mg/dL _0 Creatinine 0.70 - 1.30 mg/dL 1.05  1.22  1.30   Sodium 135 - 146 mmol/L 137  137  136   Potassium 3.5 - 5.3 mmol/L 3.9  4.4  4.1   Chloride 98 - 110 mmol/L 102  104  101   CO2 20 - 32 mmol/L _0 Calcium 8.6 - 10.3 mg/dL 9.0  9.9  9.4   Total Protein 6.1 - 8.1 g/dL 7.3  8.4  8.1   Total Bilirubin 0.2 - 1.2 mg/dL 0.5  0.5  0.5   AST 10 - 35 U/L _1 ALT 9 - 46 U/L _2 Lipid Panel     Component Value Date/Time   CHOL 150 01/16/2022 0934   CHOL 144 01/13/2021 0859   TRIG 142 01/16/2022 0934   HDL 41 01/16/2022 0934   HDL 38 (L) 01/13/2021 0859   CHOLHDL 3.7 01/16/2022 0934   LDLCALC 85 01/16/2022 0934    CBC    Component Value Date/Time   WBC 4.5 01/16/2022 0934   RBC 4.38 01/16/2022 0934   HGB 14.4 01/16/2022 0934   HCT 40.8 01/16/2022 0934   PLT 134 (L) 01/16/2022 0934   MCV 93.2 01/16/2022 0934   MCH 32.9 01/16/2022 0934   MCHC 35.3 01/16/2022 0934   RDW 12.9 01/16/2022 0934   LYMPHSABS 1,130 01/16/2022 0934   MONOABS 0.9 11/20/2019 0246   EOSABS 50 01/16/2022 0934   BASOSABS 18 01/16/2022 0934    ASSESSMENT AND PLAN:  1. Type 2 diabetes mellitus with hyperglycemia, without long-term current use of insulin ([place]) Advised patient that blood sugar goals before meals is 90-130.  Blood sugars have improved since last visit but not at goal.  I recommend increasing the metformin to 1000 mg in the morning and at 1000 mg in the evening.  Updated prescription sent to his pharmacy. -Check blood sugars twice a day before breakfast and dinner.  Bring in log in 3 weeks to see the clinical pharmacist. - POCT glucose (manual entry) - metFORMIN (GLUCOPHAGE-XR) 500 MG 24 hr tablet; Take 2  tablets (1056m) in the morning with food and 2 tablet (5072m in the evening with food.  Dispense: 360 tablet; Refill: 2  2. Screening for colon cancer - Fecal occult blood, imunochemical([facility])    Patient was given the opportunity to ask questions.  Patient verbalized understanding of the plan and was able to repeat key elements of the plan.   This documentation was completed using DrRadio producer Any transcriptional errors are unintentional.  Orders Placed This Encounter  Procedures   Fecal occult blood, imunochemical([facility])   POCT glucose (manual entry)     Requested Prescriptions    No prescriptions requested or ordered in this encounter    Return in about 4 months (around 07/16/2022) [facility].  DeKarle PlumberMD, FACP

## 2022-04-23 ENCOUNTER — Other Ambulatory Visit: Payer: Self-pay

## 2022-04-23 ENCOUNTER — Ambulatory Visit: Payer: Self-pay | Attending: Family Medicine | Admitting: Pharmacist

## 2022-04-23 DIAGNOSIS — E1165 Type 2 diabetes mellitus with hyperglycemia: Secondary | ICD-10-CM

## 2022-04-23 MED ORDER — TRUE METRIX BLOOD GLUCOSE TEST VI STRP: ORAL_STRIP | 3 refills | Status: DC

## 2022-04-23 MED ORDER — TRUE METRIX METER W/DEVICE KIT
PACK | 0 refills | Status: DC
  Filled 2022-04-23: qty 1, 30d supply, fill #0

## 2022-04-23 MED ORDER — TRUE METRIX METER W/DEVICE KIT: PACK | 0 refills | Status: DC

## 2022-04-23 MED ORDER — TRUE METRIX BLOOD GLUCOSE TEST VI STRP
ORAL_STRIP | 3 refills | Status: DC
  Filled 2022-04-23: qty 50, 50d supply, fill #0

## 2022-04-23 MED ORDER — DAPAGLIFLOZIN PROPANEDIOL 10 MG PO TABS
10.0000 mg | ORAL_TABLET | Freq: Every day | ORAL | 1 refills | Status: DC
  Filled 2022-04-23: qty 30, 30d supply, fill #0

## 2022-04-23 MED ORDER — TRUEPLUS LANCETS 28G MISC
3 refills | Status: DC
  Filled 2022-04-23: qty 100, 30d supply, fill #0

## 2022-04-23 MED ORDER — TRUEPLUS LANCETS 28G MISC: 3 refills | Status: DC

## 2022-04-23 NOTE — Progress Notes (Signed)
S:     No chief complaint on file.  51 y.o. male who presents for diabetes evaluation, education, and management.  PMH is significant for T2DM w/ albuminuria, HTN, HLD, acquired hypothyroidism, HIV (tx'd with Dovato), pulmonary TB. Patient was referred and last seen by Primary Care Provider, Dr. Laural Benes, on 03/17/2022. At that visit, he was only taking metformin 500 mg BID instead of 2 tablets (1000mg  total) in the morning and 1 tablet (500mg ) in the evening. He was told to increase metformin to 1000 mg (2 tablets) BID.  Today, patient arrives in good spirits and presents without any assistance.  Patient reports Diabetes was diagnosed in 2016. He was dx'd in a clinic. He has no known history of clinical ASCVD. No hx of CHF. No hx of CKD, however, he does has albuminuria. No known hx of pancreatitis, thyroid cancer. Regarding his recent A1c, he tells me today that prior to September, he was not taking his medications consistently nor was he eating correctly. Since his visit with Dr. 2017, he is taking his metformin as prescribed but ran out of Farxiga ~2 weeks ago.  Family/Social History:  Fhx: DM Tobacco: never smoker Alcohol: none reported   Current diabetes medications include: metformin 500 mg XR (2 tablets BID), Farxiga 10 mg  Patient reports adherence to taking all medications as prescribed, however, he brings me an empty bottle of October. Ran out of this 2 weeks ago.   Insurance coverage: none  Patient denies hypoglycemic events.  Reported home fasting blood sugars: 129-153  Reported 2 hour post-meal/random blood sugars: 125-150.  Patient denies nocturia (nighttime urination).  Patient denies neuropathy (nerve pain). Patient denies visual changes. Patient reports self foot exams.   Patient reported dietary habits: Eats 3 meals/day -Tells me he eats lots of beans, lentils, vegetables,  -Drinks: no sugar-sweetened beverages   Patient-reported exercise habits:   -Active at his job -Some weight lifting, running, walking   O:   ROS  Physical Exam  7 day average blood glucose:  no meter with him today.  No CGM in place.   Lab Results  Component Value Date   HGBA1C 12.1 (A) 02/12/2022   There were no vitals filed for this visit.  Lipid Panel     Component Value Date/Time   CHOL 150 01/16/2022 0934   CHOL 144 01/13/2021 0859   TRIG 142 01/16/2022 0934   HDL 41 01/16/2022 0934   HDL 38 (L) 01/13/2021 0859   CHOLHDL 3.7 01/16/2022 0934   LDLCALC 85 01/16/2022 0934    Clinical Atherosclerotic Cardiovascular Disease (ASCVD): No  The 10-year ASCVD risk score (Arnett DK, et al., 2019) is: 7.6%   Values used to calculate the score:     Age: 28 years     Sex: Male     Is Non-Hispanic African American: No     Diabetic: Yes     Tobacco smoker: No     Systolic Blood Pressure: 119 mmHg     Is BP treated: Yes     HDL Cholesterol: 41 mg/dL     Total Cholesterol: 150 mg/dL   A/P: Diabetes longstanding currently uncontrolled given his A1c. Patient is able to verbalize appropriate hypoglycemia management plan. Medication adherence appears suboptimal. I have refilled his 2020 and test strips. Despite being out of Farxig, his sugars are closer to goal than his A1c in September shows. Will refill Farxiga, continue metformin, and have him return in 1 month for A1c check before seeing Dr.  Johnson in late Jan.  -Continued current regimen. Refills given of Farxiga.  -Patient educated on purpose, proper use, and potential adverse effects of metformin, Farxiga.  -Extensively discussed pathophysiology of diabetes, recommended lifestyle interventions, dietary effects on blood sugar control.  -Counseled on s/sx of and management of hypoglycemia.  -Next A1c anticipated at follow-up.   Written patient instructions provided. Patient verbalized understanding of treatment plan.  Total time in face to face counseling 30 minutes.    Follow-up:   Pharmacist 1 month. PCP clinic visit 06/15/2022.  Butch Penny, PharmD, [place]y [place], CPP Clinical Pharmacist [facility] [phone]

## 2022-04-26 LAB — FECAL OCCULT BLOOD, IMMUNOCHEMICAL: Fecal Occult Bld: NEGATIVE

## 2022-06-01 ENCOUNTER — Encounter: Payer: Self-pay | Admitting: Pharmacist

## 2022-06-01 ENCOUNTER — Ambulatory Visit: Payer: Self-pay | Attending: Internal Medicine | Admitting: Pharmacist

## 2022-06-01 ENCOUNTER — Other Ambulatory Visit: Payer: Self-pay

## 2022-06-01 DIAGNOSIS — E1165 Type 2 diabetes mellitus with hyperglycemia: Secondary | ICD-10-CM

## 2022-06-01 LAB — POCT GLYCOSYLATED HEMOGLOBIN (HGB A1C): HbA1c, POC (controlled diabetic range): 9.1 % — AB (ref 0.0–7.0)

## 2022-06-01 NOTE — Progress Notes (Addendum)
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 male who presents for diabetes evaluation, education, and management.  PMH is significant for T2DM w/ albuminuria, HTN, HLD, acquired hypothyroidism, HIV (tx'd with Dovato), pulmonary TB. Patient was referred and last seen by Primary Care Provider, Dr. Wynetta Emery, on 03/17/2022. At that visit, patient had been out of [place] for ~2 weeks and Wilder Glade was restarted.   Today, patient arrives in good spirits and presents with the assistance of an online interpretor. He states he was able to restart [place] without issue. A1c today down to 9.1%, from 12.1% in September 2023. He brings his medicine bottles to his visit today. His metformin was last filled on 03/16/22  and Wilder Glade was last filled 04/23/2022, both for a 30 day supply. Both of his bottles are about half full.   Patient reports Diabetes was diagnosed in 2016. He was dx'd in a clinic. He has no known history of clinical ASCVD. No hx of CHF. No hx of CKD, however, he does has albuminuria. No known hx of pancreatitis, thyroid cancer.   Family/Social History:  Fhx: DM Tobacco: never smoker Alcohol: none reported   Current diabetes medications include: metformin 500 mg XR (2 tablets BID), Farxiga 10 mg  Patient reports adherence to taking all medications as prescribed. However, fill history and his medicine bottles today suggest patient is not fully adherent.  Insurance coverage: none  Patient denies hypoglycemic events.  Reported home fasting blood sugars: 120-160 Reported 2 hour post-meal/random blood sugars: 130s-170. None >180.   Patient denies nocturia (nighttime urination). 2x/night Patient denies neuropathy (nerve pain). Patient denies visual changes. Patient reports self foot exams.   Patient reported dietary habits: Eats 3 meals/day -Tells me he eats lots of beans, lentils, vegetables,  -Drinks: no sugar-sweetened beverages   Patient-reported exercise habits:  -Active at his job -Some  weight lifting, running, walking   O:  7 day average blood glucose:  no meter with him today.  No CGM in place.   Lab Results  Component Value Date   HGBA1C 9.1 (A) 06/01/2022   There were no vitals filed for this visit.  Lipid Panel     Component Value Date/Time   CHOL 150 01/16/2022 0934   CHOL 144 01/13/2021 0859   TRIG 142 01/16/2022 0934   HDL 41 01/16/2022 0934   HDL 38 (L) 01/13/2021 0859   CHOLHDL 3.7 01/16/2022 0934   LDLCALC 85 01/16/2022 0934    Clinical Atherosclerotic Cardiovascular Disease (ASCVD): No  The 10-year ASCVD risk score (Arnett DK, et al., 2019) is: 7.6%   Values used to calculate the score:     Age: 51 years     Sex: Male     Is Non-Hispanic African American: No     Diabetic: Yes     Tobacco smoker: No     Systolic Blood Pressure: 062 mmHg     Is BP treated: Yes     HDL Cholesterol: 41 mg/dL     Total Cholesterol: 150 mg/dL   A/P: Diabetes longstanding currently uncontrolled given his A1c (9.1%). Patient is able to verbalize appropriate hypoglycemia management plan. Medication adherence needs improvement as patient has not filled metformin sine October 2023 and is not fully adherent to [place].  -Continued current regimen. Discussed various ways to improve adherence including using a pill box, reminders, etc.  -Patient educated on purpose, proper use, and potential adverse effects of metformin, Farxiga.  -Extensively discussed pathophysiology of diabetes, recommended lifestyle interventions,  dietary effects on blood sugar control.  -Counseled on s/sx of and management of hypoglycemia.  -Next A1c April 2024.  Written patient instructions provided. Patient verbalized understanding of treatment plan.  Total time in face to face counseling 30 minutes.    Follow-up:  Pharmacist PRN PCP clinic visit 06/15/2022  Joseph Art, Pharm.D. PGY-2 Ambulatory Care Pharmacy Resident 06/01/2022 3:38 PM

## 2022-06-15 ENCOUNTER — Other Ambulatory Visit: Payer: Self-pay

## 2022-06-15 ENCOUNTER — Encounter: Payer: Self-pay | Admitting: Internal Medicine

## 2022-06-15 ENCOUNTER — Ambulatory Visit: Payer: Self-pay | Attending: Internal Medicine | Admitting: Internal Medicine

## 2022-06-15 VITALS — BP 126/84 | HR 79 | Temp 98.6°F | Wt 132.0 lb

## 2022-06-15 DIAGNOSIS — E039 Hypothyroidism, unspecified: Secondary | ICD-10-CM

## 2022-06-15 DIAGNOSIS — E785 Hyperlipidemia, unspecified: Secondary | ICD-10-CM

## 2022-06-15 DIAGNOSIS — E1159 Type 2 diabetes mellitus with other circulatory complications: Secondary | ICD-10-CM

## 2022-06-15 DIAGNOSIS — E1169 Type 2 diabetes mellitus with other specified complication: Secondary | ICD-10-CM

## 2022-06-15 DIAGNOSIS — I152 Hypertension secondary to endocrine disorders: Secondary | ICD-10-CM

## 2022-06-15 DIAGNOSIS — E1129 Type 2 diabetes mellitus with other diabetic kidney complication: Secondary | ICD-10-CM

## 2022-06-15 DIAGNOSIS — B20 Human immunodeficiency virus [HIV] disease: Secondary | ICD-10-CM

## 2022-06-15 DIAGNOSIS — E1165 Type 2 diabetes mellitus with hyperglycemia: Secondary | ICD-10-CM

## 2022-06-15 DIAGNOSIS — R809 Proteinuria, unspecified: Secondary | ICD-10-CM

## 2022-06-15 MED ORDER — TRUE METRIX BLOOD GLUCOSE TEST VI STRP
ORAL_STRIP | 3 refills | Status: DC
  Filled 2022-06-15: qty 50, 50d supply, fill #0

## 2022-06-15 MED ORDER — TRUEPLUS LANCETS 28G MISC
3 refills | Status: AC
  Filled 2022-06-15: qty 100, 100d supply, fill #0

## 2022-06-15 MED ORDER — DAPAGLIFLOZIN PROPANEDIOL 10 MG PO TABS
10.0000 mg | ORAL_TABLET | Freq: Every day | ORAL | 3 refills | Status: DC
  Filled 2022-06-15: qty 30, 30d supply, fill #0

## 2022-06-15 MED ORDER — LISINOPRIL 20 MG PO TABS
20.0000 mg | ORAL_TABLET | Freq: Every day | ORAL | 3 refills | Status: DC
  Filled 2022-06-15: qty 30, 30d supply, fill #0
  Filled 2022-09-22: qty 30, 30d supply, fill #1
  Filled 2023-05-10: qty 30, 30d supply, fill #2

## 2022-06-15 MED ORDER — TRUE METRIX METER W/DEVICE KIT
PACK | 0 refills | Status: AC
  Filled 2022-06-15: qty 1, 1d supply, fill #0

## 2022-06-15 MED ORDER — ATORVASTATIN CALCIUM 10 MG PO TABS
10.0000 mg | ORAL_TABLET | Freq: Every day | ORAL | 3 refills | Status: DC
  Filled 2022-06-15: qty 30, 30d supply, fill #0
  Filled 2022-09-22: qty 30, 30d supply, fill #1
  Filled 2023-01-01: qty 30, 30d supply, fill #2
  Filled 2023-05-10: qty 30, 30d supply, fill #3

## 2022-06-15 MED ORDER — METFORMIN HCL ER 500 MG PO TB24
1000.0000 mg | ORAL_TABLET | Freq: Two times a day (BID) | ORAL | 3 refills | Status: DC
  Filled 2022-06-15: qty 120, 30d supply, fill #0
  Filled 2022-09-22: qty 120, 30d supply, fill #1

## 2022-06-15 NOTE — Progress Notes (Signed)
Patient ID: Manuel Hayes, male    DOB: 05/09/1971  MRN: [id number]  CC: chronic ds management  Subjective: Manuel Hayes is a 52 y.o. 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    Pt did not bring meds with him.  Has med list with him.  Reports being out of Lipitor, Farxiga, Lisinopril, Metformin.  Reports being out x 5 days.  Looking at his prescriptions in the system, he still has refills on these meds.  He did not check with the pharmacy. Has not taken any meds this a.m  DM: Results for orders placed or performed in visit on 06/01/22  HgB A1c  Result Value Ref Range   Hemoglobin A1C     HbA1c POC (<> result, manual entry)     HbA1c, POC (prediabetic range)     HbA1c, POC (controlled diabetic range) 9.1 (A) 0.0 - 7.0 %  Looking at his rxns in the system, last Wilder Glade was dispensed 04/23/2022 with a 1 mth supply.  This means he would have been out 05/24/2022. Has RF remaining.  Should also be on Metformin 1 gram BID Recent A1C has improved from 12.1 to 9.1 Not checking BS.  Has Countor meter but stripes do not match   HTN: Patient supposed to be on lisinopril 20 mg daily. No CP/SOB/LE edema HL: Supposed to be on atorvastatin 10 mg daily.  Last LDL was 85.  It had improved from 186. Thyroid: Compliant with levothyroxine 75 mcg daily. Reports he still has some at home.  AIDS: Followed by Dr. Gale Journey.  Reports compliance with Dovato and Bactrim   Patient Active Problem List   Diagnosis Date Noted   Need for tetanus, diphtheria, and acellular pertussis (Tdap) vaccine 09/06/2020   Hypertension associated with diabetes ([place]) 09/06/2020   Acquired hypothyroidism 09/06/2020   Herpes zoster 04/22/2020   Pulmonary tuberculosis 02/08/2020   HIV disease ([place]) 02/08/2020   Lung nodules 01/04/2020   Pruritic rash  03/16/2018   Anemia 12/27/2017   Hyperproteinemia 07/05/2017   Hypoalbuminemia 07/05/2017   Positive for macroalbuminuria 11/21/2016   Elevated liver enzymes 08/21/2016   Benign essential hypertension 08/21/2016   Hyperlipidemia associated with type 2 diabetes mellitus ([place]) 08/21/2016   Type 2 diabetes mellitus ([place]) 08/21/2016   Blurring of visual image 08/21/2016     Current Outpatient Medications on File Prior to Visit  Medication Sig Dispense Refill   Alcohol Swabs PADS Clean fingers/equipments prior to CBG testing of your fingers 100 each 11   DOVATO 50-300 MG tablet TAKE 1 TABLET BY MOUTH DAILY 30 tablet 6   hydrocortisone (ANUSOL-HC) 2.5 % rectal cream      levothyroxine (SYNTHROID) 50 MCG tablet Take 1.5 tablets (75 mcg total) by mouth daily before breakfast. 45 tablet 2   Omega-3 Fatty Acids (FISH OIL PO) Take by mouth.     sulfamethoxazole-trimethoprim (BACTRIM DS) 800-160 MG tablet TAKE 1 TABLET BY MOUTH 3 TIMES A WEEK 36 tablet 2   gabapentin (NEURONTIN) 100 MG capsule Take 1 capsule (100 mg total) by mouth 3 (three) times daily. (Patient not taking: Reported on 05/02/2020) 90 capsule 2   No current facility-administered medications on file prior to visit.    No Known Allergies  Social History   Socioeconomic History   Marital status: Single    Spouse name: Not on file  Number of children: Not on file   Years of education: Not on file   Highest education level: Not on file  Occupational History   Not on file  Tobacco Use   Smoking status: Never   Smokeless tobacco: Never  Substance and Sexual Activity   Alcohol use: Not Currently   Drug use: Never   Sexual activity: Yes    Partners: Female    Comment: accepted condoms  Other Topics Concern   Not on file  Social History Narrative   Not on file   Social Determinants of Health   Financial Resource Strain: Not on file  Food Insecurity: Not on file  Transportation Needs: Not on file  Physical Activity:  Not on file  Stress: Not on file  Social Connections: Not on file  Intimate Partner Violence: Not on file    Family History  Problem Relation Age of Onset   Diabetes Mother    Diabetes Brother     History reviewed. No pertinent surgical history.  ROS: Review of Systems Negative except as stated above  PHYSICAL EXAM: BP 126/84   Pulse 79   Temp 98.6 F (37 C)   Wt 132 lb (59.9 kg)   BMI 24.54 kg/m   Physical Exam   General appearance - alert, well appearing, middle age hispanic male and in no distress Mental status - normal mood, behavior, speech, dress, motor activity, and thought processes Neck - supple, no significant adenopathy Chest - clear to auscultation, no wheezes, rales or rhonchi, symmetric air entry Heart - normal rate, regular rhythm, normal S1, S2, no murmurs, rubs, clicks or gallops Extremities - peripheral pulses normal, no pedal edema, no clubbing or cyanosis     Latest Ref Rng & Units 01/16/2022    9:34 AM 07/25/2021   10:13 AM 07/15/2021    9:08 AM  CMP  Glucose 65 - 99 mg/dL 643  329  518   BUN 7 - 25 mg/dL 19  28  18    Creatinine 0.70 - 1.30 mg/dL  8.41  6.60   Sodium 135 - 146 mmol/L 137  137  136   Potassium 3.5 - 5.3 mmol/L 3.9  4.4  4.1   Chloride 98 - 110 mmol/L 102  104  101   CO2 20 - 32 mmol/L 28  27  28    Calcium 8.6 - 10.3 mg/dL 9.0  9.9  9.4   Total Protein 6.1 - 8.1 g/dL 7.3  8.4  8.1   Total Bilirubin 0.2 - 1.2 mg/dL 0.5  0.5  0.5   AST 10 - 35 U/L 22  23  19    ALT 9 - 46 U/L 20  17  16     Lipid Panel     Component Value Date/Time   CHOL 150 01/16/2022 0934   CHOL 144 01/13/2021 0859   TRIG 142 01/16/2022 0934   HDL 41 01/16/2022 0934   HDL 38 (L) 01/13/2021 0859   CHOLHDL 3.7 01/16/2022 0934   LDLCALC 85 01/16/2022 0934    CBC    Component Value Date/Time   WBC 4.5 01/16/2022 0934   RBC 4.38 01/16/2022 0934   HGB 14.4 01/16/2022 0934   HCT 40.8 01/16/2022 0934   PLT 134 (L) 01/16/2022 0934   MCV 93.2  01/16/2022 0934   MCH 32.9 01/16/2022 0934   MCHC 35.3 01/16/2022 0934   RDW 12.9 01/16/2022 0934   LYMPHSABS 1,130 01/16/2022 0934   MONOABS 0.9 11/20/2019 0246   EOSABS  50 01/16/2022 0934   BASOSABS 18 01/16/2022 0934    ASSESSMENT AND PLAN:  1. Type 2 diabetes mellitus with microalbuminuria, without long-term current use of insulin (HCC) A1C improved Went over with patient how the refills work on his prescriptions.  Hopefully this will prevent future episodes of lapse in therapy.  He will continue [place] and metformin.  He will stop at the pharmacy today to get refills. I have also sent prescription for new glucometer and testing supplies. - AMB Referral to Pharmacy Medication Management - dapagliflozin propanediol (FARXIGA) 10 MG TABS tablet; Take 1 tablet (10 mg total) by mouth daily.  Dispense: 90 tablet; Refill: 3 - metFORMIN (GLUCOPHAGE-XR) 500 MG 24 hr tablet; Take 2 tablets (1,000 mg total) by mouth 2 (two) times daily with a meal.  Dispense: 120 tablet; Refill: 3 - Blood Glucose Monitoring Suppl (TRUE METRIX METER) w/Device KIT; Use to check blood sugar once daily.  Dispense: 1 kit; Refill: 0 - glucose blood (TRUE METRIX BLOOD GLUCOSE TEST) test strip; Use to check blood sugar once daily.  Dispense: 100 each; Refill: 3 - TRUEplus Lancets 28G MISC; Use to check blood sugar once daily.  Dispense: 100 each; Refill: 3  2. Hypertension associated with diabetes (HCC) Continue lisinopril 20 mg daily. - lisinopril (ZESTRIL) 20 MG tablet; Take 1 tablet (20 mg total) by mouth daily.  Dispense: 90 tablet; Refill: 3  3. Acquired hypothyroidism Continue levothyroxine. - TSH  4. Hyperlipidemia associated with type 2 diabetes mellitus (HCC) - atorvastatin (LIPITOR) 10 MG tablet; Take 1 tablet (10 mg total) by mouth daily.  Dispense: 90 tablet; Refill: 3  5. AIDS (acquired immune deficiency syndrome) ([place]) Continue Dovato and Bactrim as prescribed by his ID provider    AM[employer] Language  interpreter used during this encounter. # [id number]  Patient was given the opportunity to ask questions.  Patient verbalized understanding of the plan and was able to repeat key elements of the plan.   This documentation was completed using Radio producer.  Any transcriptional errors are unintentional.  Orders Placed This Encounter  Procedures   TSH   AMB Referral to Pharmacy Medication Management     Requested Prescriptions   Signed Prescriptions Disp Refills   atorvastatin (LIPITOR) 10 MG tablet 90 tablet 3    Sig: Take 1 tablet (10 mg total) by mouth daily.   dapagliflozin propanediol (FARXIGA) 10 MG TABS tablet 90 tablet 3    Sig: Take 1 tablet (10 mg total) by mouth daily.   lisinopril (ZESTRIL) 20 MG tablet 90 tablet 3    Sig: Take 1 tablet (20 mg total) by mouth daily.   metFORMIN (GLUCOPHAGE-XR) 500 MG 24 hr tablet 120 tablet 3    Sig: Take 2 tablets (1,000 mg total) by mouth 2 (two) times daily with a meal.   Blood Glucose Monitoring Suppl (TRUE METRIX METER) w/Device KIT 1 kit 0    Sig: Use to check blood sugar once daily.   glucose blood (TRUE METRIX BLOOD GLUCOSE TEST) test strip 100 each 3    Sig: Use to check blood sugar once daily.   TRUEplus Lancets 28G MISC 100 each 3    Sig: Use to check blood sugar once daily.    Return in about 4 months (around 10/14/2022).  Karle Plumber, MD, FACP

## 2022-06-16 ENCOUNTER — Telehealth: Payer: Self-pay | Admitting: Pharmacist

## 2022-06-16 ENCOUNTER — Other Ambulatory Visit: Payer: Self-pay

## 2022-06-16 NOTE — Telephone Encounter (Signed)
Called patient to complete consent for the LIBERATE study. Pt was unavailable. I left a HIPAA-compliant VM with instructions to return my call. Will also leave notes in his upcoming appt to see me for possible enrollment.   Benard Halsted, PharmD, [facility] [place], [place] [phone]

## 2022-06-17 ENCOUNTER — Other Ambulatory Visit: Payer: Self-pay | Admitting: Pharmacist

## 2022-06-17 ENCOUNTER — Other Ambulatory Visit: Payer: Self-pay

## 2022-06-17 MED ORDER — EMPAGLIFLOZIN 25 MG PO TABS
25.0000 mg | ORAL_TABLET | Freq: Every day | ORAL | 3 refills | Status: DC
  Filled 2022-06-17: qty 30, 30d supply, fill #0
  Filled 2022-09-22: qty 30, 30d supply, fill #1

## 2022-06-22 ENCOUNTER — Other Ambulatory Visit: Payer: Self-pay

## 2022-07-10 ENCOUNTER — Other Ambulatory Visit: Payer: Self-pay | Admitting: Internal Medicine

## 2022-07-16 ENCOUNTER — Ambulatory Visit: Payer: Self-pay | Attending: Internal Medicine | Admitting: Internal Medicine

## 2022-07-16 ENCOUNTER — Encounter: Payer: Self-pay | Admitting: Internal Medicine

## 2022-07-16 ENCOUNTER — Other Ambulatory Visit: Payer: Self-pay

## 2022-07-16 VITALS — BP 119/75 | HR 71 | Temp 98.6°F | Ht 61.0 in | Wt 132.0 lb

## 2022-07-16 DIAGNOSIS — R809 Proteinuria, unspecified: Secondary | ICD-10-CM

## 2022-07-16 DIAGNOSIS — E1159 Type 2 diabetes mellitus with other circulatory complications: Secondary | ICD-10-CM

## 2022-07-16 DIAGNOSIS — I152 Hypertension secondary to endocrine disorders: Secondary | ICD-10-CM

## 2022-07-16 DIAGNOSIS — E039 Hypothyroidism, unspecified: Secondary | ICD-10-CM

## 2022-07-16 DIAGNOSIS — E1129 Type 2 diabetes mellitus with other diabetic kidney complication: Secondary | ICD-10-CM

## 2022-07-16 MED ORDER — ZOSTER VAC RECOMB ADJUVANTED 50 MCG/0.5ML IM SUSR
0.5000 mL | Freq: Once | INTRAMUSCULAR | 0 refills | Status: AC
  Filled 2022-07-16: qty 1, 1d supply, fill #0

## 2022-07-16 MED ORDER — LEVOTHYROXINE SODIUM 50 MCG PO TABS
75.0000 ug | ORAL_TABLET | Freq: Every day | ORAL | 3 refills | Status: DC
  Filled 2022-07-16: qty 45, 30d supply, fill #0

## 2022-07-16 NOTE — Progress Notes (Signed)
Patient ID: Manuel Hayes, male    DOB: 06-15-1970  MRN: [id number]  CC: Diabetes (DM f/u. Med refill - requesting 90 day supply./Already received flu vax. Yes to shingles vax)   Subjective: Manuel Hayes is a 52 y.o. 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     AMN Language interpreter used during this encounter. #Ana [id number] Pt has meds with him today  DM: Results for orders placed or performed in visit on 06/01/22  HgB A1c  Result Value Ref Range   Hemoglobin A1C     HbA1c POC (<> result, manual entry)     HbA1c, POC (prediabetic range)     HbA1c, POC (controlled diabetic range) 9.1 (A) 0.0 - 7.0 %  On last visit he was out of all meds.  Rfs given and he has all meds with him today. Reports compliance with Jardiance 25 mg daily and metformin XR 1 g twice a day. Checks BS in mornings.  Gives range 120-140 in a.m and same at bedtimes when he checks it several times a wk Pt with microalbumin with last level 295 in 01/2022 Doing well with eating habits.  Does a little jogging 3x/wk with some wgh training. HTH: Should be on lisinopril 20 mg daily.  Reports compliance with taking and limiting salt. HL: On Lipitor 10 mg daily. Reports taking the Levothyroxine 50 mg 1.5 tab daily.  Due for recheck TSH  Patient Active Problem List   Diagnosis Date Noted   Need for tetanus, diphtheria, and acellular pertussis (Tdap) vaccine 09/06/2020   Hypertension associated with diabetes ([place]) 09/06/2020   Acquired hypothyroidism 09/06/2020   Herpes zoster 04/22/2020   Pulmonary tuberculosis 02/08/2020   HIV disease ([place]) 02/08/2020   Lung nodules 01/04/2020   Pruritic rash 03/16/2018   Anemia 12/27/2017   Hyperproteinemia 07/05/2017   Hypoalbuminemia 07/05/2017   Positive for macroalbuminuria 11/21/2016    Elevated liver enzymes 08/21/2016   Benign essential hypertension 08/21/2016   Hyperlipidemia associated with type 2 diabetes mellitus ([place]) 08/21/2016   Type 2 diabetes mellitus ([place]) 08/21/2016   Blurring of visual image 08/21/2016     Current Outpatient Medications on File Prior to Visit  Medication Sig Dispense Refill   Alcohol Swabs PADS Clean fingers/equipments prior to CBG testing of your fingers 100 each 11   atorvastatin (LIPITOR) 10 MG tablet Take 1 tablet (10 mg total) by mouth daily. 90 tablet 3   Blood Glucose Monitoring Suppl (TRUE METRIX METER) w/Device KIT Use to check blood sugar once daily. 1 kit 0   DOVATO 50-300 MG tablet TAKE 1 TABLET BY MOUTH DAILY 30 tablet 6   empagliflozin (JARDIANCE) 25 MG TABS tablet Take 1 tablet (25 mg total) by mouth daily before breakfast. 30 tablet 3   glucose blood (TRUE METRIX BLOOD GLUCOSE TEST) test strip Use to check blood sugar once daily. 100 each 3   hydrocortisone (ANUSOL-HC) 2.5 % rectal cream      lisinopril (ZESTRIL) 20 MG tablet Take 1 tablet (20 mg total) by mouth daily. 90 tablet 3   metFORMIN (GLUCOPHAGE-XR) 500 MG 24 hr tablet Take 2 tablets (1,000 mg total) by mouth 2 (two) times daily with a meal. 120 tablet 3   Omega-3 Fatty Acids (FISH OIL PO) Take by mouth.     sulfamethoxazole-trimethoprim (BACTRIM DS) 800-160 MG tablet TAKE  1 TABLET BY MOUTH 3 TIMES A WEEK 36 tablet 2   TRUEplus Lancets 28G MISC Use to check blood sugar once daily. 100 each 3   gabapentin (NEURONTIN) 100 MG capsule Take 1 capsule (100 mg total) by mouth 3 (three) times daily. (Patient not taking: Reported on 05/02/2020) 90 capsule 2   No current facility-administered medications on file prior to visit.    No Known Allergies  Social History   Socioeconomic History   Marital status: Single    Spouse name: Not on file   Number of children: Not on file   Years of education: Not on file   Highest education level: Not on file  Occupational History    Not on file  Tobacco Use   Smoking status: Never   Smokeless tobacco: Never  Substance and Sexual Activity   Alcohol use: Not Currently   Drug use: Never   Sexual activity: Yes    Partners: Female    Comment: accepted condoms  Other Topics Concern   Not on file  Social History Narrative   Not on file   Social Determinants of Health   Financial Resource Strain: Not on file  Food Insecurity: Not on file  Transportation Needs: Not on file  Physical Activity: Not on file  Stress: Not on file  Social Connections: Not on file  Intimate Partner Violence: Not on file    Family History  Problem Relation Age of Onset   Diabetes Mother    Diabetes Brother     No past surgical history on file.  ROS: Review of Systems Negative except as stated above  PHYSICAL EXAM: BP 119/75 (BP Location: Left Arm, Patient Position: Sitting, Cuff Size: Normal)   Pulse 71   Temp 98.6 F (37 C) (Oral)   Ht '5\' 1"'$  (1.549 m)   Wt 132 lb (59.9 kg)   SpO2 99%   BMI 24.94 kg/m   Physical Exam   General appearance - alert, well appearing, and in no distress Mental status - normal mood, behavior, speech, dress, motor activity, and thought processes Neck - supple, no significant adenopathy Chest - clear to auscultation, no wheezes, rales or rhonchi, symmetric air entry Heart - normal rate, regular rhythm, normal S1, S2, no murmurs, rubs, clicks or gallops Extremities - peripheral pulses normal, no pedal edema, no clubbing or cyanosis     Latest Ref Rng & Units 01/16/2022    9:34 AM 07/25/2021   10:13 AM 07/15/2021    9:08 AM  CMP  Glucose 65 - 99 mg/dL 188  139  138   BUN 7 - 25 mg/dL '19  28  18   '$ Creatinine 0.70 - 1.30 mg/dL 1.05  1.22  1.30   Sodium 135 - 146 mmol/L 137  137  136   Potassium 3.5 - 5.3 mmol/L 3.9  4.4  4.1   Chloride 98 - 110 mmol/L 102  104  101   CO2 20 - 32 mmol/L '28  27  28   '$ Calcium 8.6 - 10.3 mg/dL 9.0  9.9  9.4   Total Protein 6.1 - 8.1 g/dL 7.3  8.4  8.1    Total Bilirubin 0.2 - 1.2 mg/dL 0.5  0.5  0.5   AST 10 - 35 U/L '22  23  19   '$ ALT 9 - 46 U/L '20  17  16    '$ Lipid Panel     Component Value Date/Time   CHOL 150 01/16/2022 0934   CHOL 144 01/13/2021  0859   TRIG 142 01/16/2022 0934   HDL 41 01/16/2022 0934   HDL 38 (L) 01/13/2021 0859   CHOLHDL 3.7 01/16/2022 0934   LDLCALC 85 01/16/2022 0934    CBC    Component Value Date/Time   WBC 4.5 01/16/2022 0934   RBC 4.38 01/16/2022 0934   HGB 14.4 01/16/2022 0934   HCT 40.8 01/16/2022 0934   PLT 134 (L) 01/16/2022 0934   MCV 93.2 01/16/2022 0934   MCH 32.9 01/16/2022 0934   MCHC 35.3 01/16/2022 0934   RDW 12.9 01/16/2022 0934   LYMPHSABS 1,130 01/16/2022 0934   MONOABS 0.9 11/20/2019 0246   EOSABS 50 01/16/2022 0934   BASOSABS 18 01/16/2022 0934    ASSESSMENT AND PLAN: 1. Type 2 diabetes mellitus with microalbuminuria, without long-term current use of insulin ([place]) Commended him on compliance with medications.  He will continue metformin 1000 mg twice a day and Jardiance 25 mg daily.  Continue healthy eating habits and regular exercise. - AMB Referral to Pharmacy Medication Management  2. Hypertension associated with diabetes ([place]) At goal.  Continue lisinopril 20 mg daily.  3. Acquired hypothyroidism Continue levothyroxine 75 mcg daily. - levothyroxine (SYNTHROID) 50 MCG tablet; Take 1.5 tablets (75 mcg total) by mouth daily before breakfast.  Dispense: 45 tablet; Refill: 3 - TSH     Patient was given the opportunity to ask questions.  Patient verbalized understanding of the plan and was able to repeat key elements of the plan.   This documentation was completed using Radio producer.  Any transcriptional errors are unintentional.  Orders Placed This Encounter  Procedures   TSH   AMB Referral to Pharmacy Medication Management     Requested Prescriptions   Signed Prescriptions Disp Refills   levothyroxine (SYNTHROID) 50 MCG tablet 45 tablet  3    Sig: Take 1.5 tablets (75 mcg total) by mouth daily before breakfast.   Zoster Vaccine Adjuvanted [facility]) injection 1 each 0    Sig: Inject 0.5 mLs into the muscle once for 1 dose.    Return in about 4 months (around 11/14/2022).  Karle Plumber, MD, FACP

## 2022-07-17 ENCOUNTER — Telehealth: Payer: Self-pay | Admitting: Pharmacist

## 2022-07-17 LAB — TSH: TSH: 2.39 u[IU]/mL (ref 0.450–4.500)

## 2022-07-17 NOTE — Telephone Encounter (Signed)
LIBERATE Study  Received referral for patient participation in the LIBERATE CGM Study. Contacted patient to discuss study and confirmed HIPAA identifiers. Confirmed patient was provided the LIBERATE Study Information Sheet and any questions were answered.   Confirmed that patient meets study criteria by having a diagnosis of Type 2 Diabetes, is not currently on insulin, and most recent A1c is >8%.  Patient declined to provide verbal consent to participate in the study. Consent documented in electronic medical record.   - Discussed use of CGM and the purpose of LIBERATE. His main concern is working in [employer] and possibly damaging the sensor. Even after counseling, he would like to hold off on the trial for now. He will let me know in the future if he's interested.  Benard Halsted, PharmD, [facility] [place], [place] [phone]

## 2022-08-10 ENCOUNTER — Encounter: Payer: Self-pay | Admitting: Internal Medicine

## 2022-08-10 ENCOUNTER — Ambulatory Visit (INDEPENDENT_AMBULATORY_CARE_PROVIDER_SITE_OTHER): Payer: Self-pay | Admitting: Internal Medicine

## 2022-08-10 ENCOUNTER — Other Ambulatory Visit: Payer: Self-pay

## 2022-08-10 VITALS — BP 135/79 | HR 70 | Temp 98.3°F | Wt 135.0 lb

## 2022-08-10 DIAGNOSIS — B20 Human immunodeficiency virus [HIV] disease: Secondary | ICD-10-CM

## 2022-08-10 NOTE — Progress Notes (Signed)
Chief complaint: f/u pulm TB and hiv care  Subjective:    Patient ID: Manuel Hayes, male    DOB: Nov 14, 1970, 52 y.o.   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 He was changed from rifampin to rifabutin for preparation of starting ART.  -he doesn't know how he got hiv. No hx ivdu. Heterosexual. Prior married in [place]. 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 He has labs monitored along with recent sputum  He still has some dry mouth. He denies headache, visual blurriness, eye pain, visual loss  He is eating well and gaining weight  No n/v/rash, joint pain, diarrhea.   dovato started 10/14  meds reviewed: Dovato His home dm2 meds TB meds   04/02/20 id f/u Doing well, gaining weight (110 --> 130 pounds) Feeling well, no complaint Cough much improved Not seeing his PCP at [facility] clinic as afraid he can't afford medications. Off and on lovastatin, metformin, glipizide, and lisinopril and levothyroxine Taking dovato; not taking bactrim Still getting DOT for tb meds. Finished with intensive therapy 2 months on 10/30. He is on b6, rifabutin, and inh. No side effect   12/16 id f/u HIV no missed dose pulm tb -- on DOT. No concern. He doesn't know when his last sputum testing was Patient still haven't seen his pcp at [facility] clinic (financial) He brings his pill box today dovato Bactrim Nystatin  liquid Lisinopril Metformin Valacyclovir (he is supposed to be on synthroid, lovastatin, and glipizide as well); last sugar check was very high He in the mean time developed: 1) right eye pain/redness 3 days; no f/c or decreased vision. He has an eye doctor next visit 05/2020. He is on tb treatment on continuation phase now. His last cxr 03/2020 showed scarring left lower lobe. No f/c/nightsweat/cough/weight loss. Previously has some blurry vision but initial ophthalmologic visit no concern for infection; serologic w/u syphilis/toxo/cmv negative. No penile rash/discharge 2) recently seen by urgent care for right thigh shingles given valacyclovir; has 1 more pill to take. Lesions had scabbed over; pain much better  Labs to be done today   05/31/2020 id f/u He was referred and finally saw internal medicine clinic 1/07 at [facility] for primary care He returns today for tb/hiv care Shingle had resolved Never saw ophthalmology (awaiting financial aid), however eye redness/discomfort resolved No f/c/n/v/diarrhea No cough Reviewed labs. Platelet has been at just above 50 Getting DOT for tb tx Compliant no missing dose of dovato  07/24/20 id f/u Complain of bilateral hand pain in the ip joints for about 1-2 month and also ankle/toes. These are most pronounced in the heels and the fingers. No wrist, knees, elbows or back pain. Some shoulder pain No skin rash No fever, chill Pain stable, 8/10. Constant daily.  No swelling Sx improves as the day goes along. Sx worst in the morning, and after prolonged rest including stiffness No family hx rheumatoid arthritis or lupus or autoimmune  disease No fatigue, oral/genital ulcer, new visual changes/blurriness/pain No dysuria/penile discharge   08/23/20 id f/u Patient is back here today for ongoing joint pain (wrist/hands, hip, knees/heels, elbows) Finished TB treatment already on 4/06 No fever, chill Good/normal energy level No  depression/insomnia/anxiety We review labs from last visit --> crp, lft normal; wbc normal; platelet at 100s He is on bid tivicay and daily descovy right now due to tb meds    10/02/20 Doing well Bilateral hands pain chronic No f/c/n/v/diarrhea Takes dovato everyday no missed dose  Hx via language interpreter   01/03/2021 Hx via language interpreter Patient without complaint No f/c/nightsweat/weight loss/decreased appetite His joint pain hurts a lot less now in hands and left shoulder. It hurts more at night not particularly at rest.  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 Renting a room from a friend No sexual relationship or encounter Wife in [place], haven't met in more than 5 years; he sends money to her   07/25/2021 id clinic follow up Tolerating dovato Michela Pitcher he is taking 2 medications prescribed by [place] and other medictions from medicine clinic He is adamant that he is not missing any dose of dovato He takes it before his food (breakfast) He doesn't take any vitamins/minerals rx'ed or over the counter Labs done 07/15/21 reviewed Lab Results  Component Value Date   CD4TCELL 9 (L) 01/16/2022   CD4TABS 129 (L) 07/24/2020  Cd4 105 (stagnant around 7-8% the last 2 years) Rpr nonreactive; hep c ab and hep b sag/core ab/sAb nonreactive Hiv rna 32; one time undetected 10/02/20 and 07/24/20 other than that low level viremia but <100 since 04/2020 (tx started 01/2020)  Good appetite No weight loss No cough; no night sweat/f/c  Denies sexual encounter since (although he was taking condoms with him). He wants std screen. He has sex with women only   01/29/22 id clinic f/u Social: working painting for house renovation; lives with a roommate; no smoking/street drug use; not sure when last sexual encounter was but denies currently being sexually  active  No missed dose dovato the last 4 weeks. Continues on bactrim prophy  Sees internal medicine for his htn/dm2/hypothyroidism  He has no complaint or health concern today    08/10/22 id clinic f/u Doing very well. 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  Family History  Problem Relation Age of Onset   Diabetes Mother    Diabetes Brother       Social History   Socioeconomic History   Marital status: Single    Spouse name: Not on file   Number of children: Not on file   Years of education: Not on file   Highest education level: Not on file  Occupational History   Not on file  Tobacco Use   Smoking status: Never   Smokeless tobacco: Never  Substance and Sexual Activity   Alcohol use: Not Currently  Drug use: Never   Sexual activity: Yes    Partners: Female    Comment: accepted condoms  Other Topics Concern   Not on file  Social History Narrative   Not on file   Social Determinants of Health   Financial Resource Strain: Not on file  Food Insecurity: Not on file  Transportation Needs: Not on file  Physical Activity: Not on file  Stress: Not on file  Social Connections: Not on file    No Known Allergies   Current Outpatient Medications:    Alcohol Swabs PADS, Clean fingers/equipments prior to CBG testing of your fingers, Disp: 100 each, Rfl: 11   atorvastatin (LIPITOR) 10 MG tablet, Take 1 tablet (10 mg total) by mouth daily., Disp: 90 tablet, Rfl: 3   Blood Glucose Monitoring Suppl (TRUE METRIX METER) w/Device KIT, Use to check  blood sugar once daily., Disp: 1 kit, Rfl: 0   DOVATO 50-300 MG tablet, TAKE 1 TABLET BY MOUTH DAILY, Disp: 30 tablet, Rfl: 6   empagliflozin (JARDIANCE) 25 MG TABS tablet, Take 1 tablet (25 mg total) by mouth daily before breakfast., Disp: 30 tablet, Rfl: 3   glucose blood (TRUE METRIX BLOOD GLUCOSE TEST) test strip, Use to check blood sugar once daily., Disp: 100 each, Rfl: 3   hydrocortisone (ANUSOL-HC) 2.5 % rectal cream, , Disp: , Rfl:    levothyroxine (SYNTHROID) 50 MCG tablet, Take 1.5 tablets (75 mcg total) by mouth daily before breakfast., Disp: 45 tablet, Rfl: 3   lisinopril (ZESTRIL) 20 MG tablet, Take 1 tablet (20 mg total) by mouth daily., Disp: 90 tablet, Rfl: 3   metFORMIN (GLUCOPHAGE-XR) 500 MG 24 hr tablet, Take 2 tablets (1,000 mg total) by mouth 2 (two) times daily with a meal., Disp: 120 tablet, Rfl: 3   Omega-3 Fatty Acids (FISH OIL PO), Take by mouth., Disp: , Rfl:    sulfamethoxazole-trimethoprim (BACTRIM DS) 800-160 MG tablet, TAKE 1 TABLET BY MOUTH 3 TIMES A WEEK, Disp: 36 tablet, Rfl: 2   TRUEplus Lancets 28G MISC, Use to check blood sugar once daily., Disp: 100 each, Rfl: 3   gabapentin (NEURONTIN) 100 MG capsule, Take 1 capsule (100 mg total) by mouth 3 (three) times daily. (Patient not taking: Reported on 05/02/2020), Disp: 90 capsule, Rfl: 2      Objective:    Vitals reviewed Vitals:   08/10/22 0846  BP: 135/79  Pulse: 70  Temp: 98.3 F (36.8 C)  SpO2: 100%    General/constitutional: no distress, pleasant HEENT: Normocephalic, PER, Conj Clear, EOMI, Oropharynx clear Neck supple CV: rrr no mrg Lungs: clear to auscultation, normal respiratory effort Abd: Soft, Nontender Ext: no edema Skin: No Rash Neuro: nonfocal MSK: no peripheral joint swelling/tenderness/warmth; back spines nontender       Labs: Lab Results  Component Value Date   WBC 4.5 01/16/2022   HGB 14.4 01/16/2022   HCT 40.8 01/16/2022   MCV 93.2 01/16/2022   PLT 134 (L)  [id number]   Last metabolic panel Lab Results  Component Value Date   GLUCOSE 188 (H) 01/16/2022   NA 137 01/16/2022   K 3.9 01/16/2022   CL 102 01/16/2022   CO2 28 01/16/2022   BUN 19 01/16/2022   CREATININE 1.05 01/16/2022   GFRNONAA 63 07/24/2020   GFRAA 73 07/24/2020   CALCIUM 9.0 01/16/2022   PROT 7.3 01/16/2022   ALBUMIN 2.2 (L) 11/20/2019   BILITOT 0.5 01/16/2022   ALKPHOS 331 (H) 11/20/2019   AST 22 01/16/2022  ALT 20 01/16/2022   ANIONGAP 6 11/20/2019    3/09 cr 1.3; lft normal; cbc 4/13/103; tsh 4.43; a1c 6.3; ana negative; rf/ccp negative 3/09 lipid 180; 36; 103; 310  Serology: 06/2021 rpr negative; hepatis acute panel nr  01/2020 toxoplasma IgG positive; hep b sAg/sAb/cAb NR; hep C ab negative; Hep A ab reactive; cmv IgM and hsv1&2 IgM negative; vzv igg positive 01/2020 urine gc/chlam pcr negative 01/2020 rpr negative  hiv labs        cd4 (%)  /  VL 9/23              50 (3%) / 197k 11/16                          / 373 12/16           166 (6)    /  66 05/31/20                     /  25 07/2020        129 (8)   /  <20 04/29                          /  47 05/18                          /  undetectable 12/19/20                     /  50s 06/2021        107 (7)  /   30s 01/2022        100 (9)  /   <20  Imaging: 11/16 chest xray left lung scarring  08/21/20 chest xray Redemonstration of nodularity of the left lung base, unchanged from prior chest x-ray and CT, with no evidence of acute cardiopulmonary disease      Assessment & Plan:    #Pulmonary TB: #thrombocytopenia, meds induced Started ATB tx first week of 01/2020. Finished intensive tx 10/29.  AFB sputum surveillance ngative on 9/5, 6, 19, 20, 10/3, 4, 17, 18.  03/2020 cxr residual scarring LLL, no cavity Developed thrombocytopenia into 50s, thought to be related to rifampin, but improved Finished tb meds 4/6. Platelet recovered while on tb meds  -No sign of active infection/relapse as of  08/10/22 -will continue annual screening for b sx/cough   #AIDS/HIV disease: started on dovato 02/29/2020. No prior therapy (new dx and referred to our clinic). No evidence Hepatitis B. Genotype 02/08/2020 no RTI or PI resistance (mutations -- RT a98S, v118I, R211k; PR I13v, d60e, l63p, v77i). He is doing very well on Dovato, and very compliant.   Continues to do well on dovato, compliant     -discussed u=u -encourage compliance -continue current HIV medication -labs today -f/u in 6 months -continue bactrim for now pending cd4 retesting    #HCM -vaccination/hepatitis b  No hx hep b exposure/vaccination Cd4 still <200, so will defer getting vaccine  -std screening 01/2022 rpr and urine gc/chlamydia negative 08/10/22 doesn't want them -hx tb. No b sx -cancer screening Advise patient to discuss with pcp to get colonoscopy for screening colorectal cancer

## 2022-08-10 NOTE — Patient Instructions (Signed)
Continue  Biktarvy Bactrim Atorvastatin     Labs today   See me in 6 months.   Thank you

## 2022-08-11 LAB — T-HELPER CELLS (CD4) COUNT (NOT AT ARMC)
CD4 % Helper T Cell: 12 % — ABNORMAL LOW (ref 33–65)
CD4 T Cell Abs: 92 /uL — ABNORMAL LOW (ref 400–1790)

## 2022-08-12 ENCOUNTER — Other Ambulatory Visit: Payer: Self-pay | Admitting: Internal Medicine

## 2022-08-12 DIAGNOSIS — E039 Hypothyroidism, unspecified: Secondary | ICD-10-CM

## 2022-08-13 LAB — CBC
HCT: 42.5 % (ref 38.5–50.0)
Hemoglobin: 14.3 g/dL (ref 13.2–17.1)
MCH: 32.1 pg (ref 27.0–33.0)
MCHC: 33.6 g/dL (ref 32.0–36.0)
MCV: 95.3 fL (ref 80.0–100.0)
MPV: 11.2 fL (ref 7.5–12.5)
Platelets: 137 10*3/uL — ABNORMAL LOW (ref 140–400)
RBC: 4.46 10*6/uL (ref 4.20–5.80)
RDW: 13.5 % (ref 11.0–15.0)
WBC: 4.6 10*3/uL (ref 3.8–10.8)

## 2022-08-13 LAB — COMPLETE METABOLIC PANEL WITH GFR
AG Ratio: 1 (calc) (ref 1.0–2.5)
ALT: 19 U/L (ref 9–46)
AST: 21 U/L (ref 10–35)
Albumin: 3.6 g/dL (ref 3.6–5.1)
Alkaline phosphatase (APISO): 129 U/L (ref 35–144)
BUN: 23 mg/dL (ref 7–25)
CO2: 26 mmol/L (ref 20–32)
Calcium: 8.8 mg/dL (ref 8.6–10.3)
Chloride: 100 mmol/L (ref 98–110)
Creat: 1.19 mg/dL (ref 0.70–1.30)
Globulin: 3.6 g/dL (calc) (ref 1.9–3.7)
Glucose, Bld: 167 mg/dL — ABNORMAL HIGH (ref 65–99)
Potassium: 4.1 mmol/L (ref 3.5–5.3)
Sodium: 133 mmol/L — ABNORMAL LOW (ref 135–146)
Total Bilirubin: 0.4 mg/dL (ref 0.2–1.2)
Total Protein: 7.2 g/dL (ref 6.1–8.1)
eGFR: 73 mL/min/{1.73_m2} (ref >=60)

## 2022-08-13 LAB — HIV-1 RNA QUANT-NO REFLEX-BLD
HIV 1 RNA Quant: <20 Copies/mL — ABNORMAL HIGH
HIV-1 RNA Quant, Log: <1.3 Log cps/mL — ABNORMAL HIGH

## 2022-09-22 ENCOUNTER — Other Ambulatory Visit: Payer: Self-pay

## 2022-09-22 ENCOUNTER — Telehealth: Payer: Self-pay | Admitting: Pharmacist

## 2022-09-22 NOTE — Telephone Encounter (Signed)
Patient needs a PA for Jardiance. Can we submit this for him?

## 2022-09-23 ENCOUNTER — Other Ambulatory Visit: Payer: Self-pay

## 2022-09-24 ENCOUNTER — Other Ambulatory Visit: Payer: Self-pay

## 2022-10-01 ENCOUNTER — Other Ambulatory Visit: Payer: Self-pay

## 2022-10-07 ENCOUNTER — Other Ambulatory Visit: Payer: Self-pay | Admitting: Internal Medicine

## 2022-10-07 DIAGNOSIS — B20 Human immunodeficiency virus [HIV] disease: Secondary | ICD-10-CM

## 2022-10-16 ENCOUNTER — Ambulatory Visit: Payer: Self-pay | Admitting: Internal Medicine

## 2022-11-17 ENCOUNTER — Ambulatory Visit: Payer: Self-pay | Attending: Internal Medicine | Admitting: Internal Medicine

## 2022-11-17 ENCOUNTER — Encounter: Payer: Self-pay | Admitting: Internal Medicine

## 2022-11-17 ENCOUNTER — Other Ambulatory Visit: Payer: Self-pay

## 2022-11-17 VITALS — BP 128/73 | HR 90 | Temp 98.9°F | Ht 61.0 in | Wt 139.0 lb

## 2022-11-17 DIAGNOSIS — E785 Hyperlipidemia, unspecified: Secondary | ICD-10-CM

## 2022-11-17 DIAGNOSIS — B20 Human immunodeficiency virus [HIV] disease: Secondary | ICD-10-CM

## 2022-11-17 DIAGNOSIS — E1129 Type 2 diabetes mellitus with other diabetic kidney complication: Secondary | ICD-10-CM

## 2022-11-17 DIAGNOSIS — E119 Type 2 diabetes mellitus without complications: Secondary | ICD-10-CM

## 2022-11-17 DIAGNOSIS — I152 Hypertension secondary to endocrine disorders: Secondary | ICD-10-CM

## 2022-11-17 DIAGNOSIS — E1159 Type 2 diabetes mellitus with other circulatory complications: Secondary | ICD-10-CM

## 2022-11-17 DIAGNOSIS — E1169 Type 2 diabetes mellitus with other specified complication: Secondary | ICD-10-CM

## 2022-11-17 DIAGNOSIS — E039 Hypothyroidism, unspecified: Secondary | ICD-10-CM

## 2022-11-17 DIAGNOSIS — Z23 Encounter for immunization: Secondary | ICD-10-CM

## 2022-11-17 DIAGNOSIS — Z7984 Long term (current) use of oral hypoglycemic drugs: Secondary | ICD-10-CM

## 2022-11-17 DIAGNOSIS — R809 Proteinuria, unspecified: Secondary | ICD-10-CM

## 2022-11-17 LAB — POCT GLYCOSYLATED HEMOGLOBIN (HGB A1C): HbA1c, POC (controlled diabetic range): 10 % — AB (ref 0.0–7.0)

## 2022-11-17 LAB — POCT URINALYSIS DIP (CLINITEK)
Bilirubin, UA: NEGATIVE
Glucose, UA: >=1000 mg/dL — AB
Ketones, POC UA: NEGATIVE mg/dL
Leukocytes, UA: NEGATIVE
Nitrite, UA: NEGATIVE
POC PROTEIN,UA: 100 — AB
Spec Grav, UA: 1.015 (ref 1.010–1.025)
Urobilinogen, UA: 0.2 E.U./dL
pH, UA: 7 (ref 5.0–8.0)

## 2022-11-17 LAB — GLUCOSE, POCT (MANUAL RESULT ENTRY): POC Glucose: 426 mg/dl — AB (ref 70–99)

## 2022-11-17 MED ORDER — METFORMIN HCL ER 500 MG PO TB24
1000.0000 mg | ORAL_TABLET | Freq: Two times a day (BID) | ORAL | 3 refills | Status: DC
Start: 2022-11-17 — End: 2023-11-10
  Filled 2022-11-17: qty 120, 30d supply, fill #0
  Filled 2023-01-01: qty 120, 30d supply, fill #1
  Filled 2023-05-10: qty 120, 30d supply, fill #2
  Filled 2023-08-24: qty 120, 30d supply, fill #3

## 2022-11-17 MED ORDER — GLIMEPIRIDE 2 MG PO TABS
2.0000 mg | ORAL_TABLET | Freq: Every day | ORAL | 1 refills | Status: DC
Start: 2022-11-17 — End: 2023-01-01
  Filled 2022-11-17: qty 30, 30d supply, fill #0

## 2022-11-17 MED ORDER — ZOSTER VAC RECOMB ADJUVANTED 50 MCG/0.5ML IM SUSR
0.5000 mL | Freq: Once | INTRAMUSCULAR | 0 refills | Status: AC
Start: 2022-11-17 — End: 2022-11-18
  Filled 2022-11-17: qty 0.5, 1d supply, fill #0

## 2022-11-17 NOTE — Progress Notes (Signed)
Patient ID: Manuel Hayes, male    DOB: February 06, 1971  MRN: [id number]  CC: Diabetes (DM f/u. Med refills. /No questions / concerns. Valentino Hue to shingles vax. )   Subjective: Manuel Hayes is a 52 y.o. 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      [employer] Language interpreter used during this encounter. #[id number].    1. Type 2 diabetes mellitus with microalbuminuria, without long-term current use of insulin (HCC) Results for orders placed or performed in visit on 11/17/22  POCT glucose (manual entry)  Result Value Ref Range   POC Glucose 426 (A) 70 - 99 mg/dl  POCT glycosylated hemoglobin (Hb A1C)  Result Value Ref Range   Hemoglobin A1C     HbA1c POC (<> result, manual entry)     HbA1c, POC (prediabetic range)     HbA1c, POC (controlled diabetic range) 10.0 (A) 0.0 - 7.0 %  POCT URINALYSIS DIP (CLINITEK)  Result Value Ref Range   Color, UA yellow yellow   Clarity, UA clear clear   Glucose, UA >=1,000 (A) negative mg/dL   Bilirubin, UA negative negative   Ketones, POC UA negative negative mg/dL   Spec Grav, UA 9.147 8.295 - 1.025   Blood, UA trace-intact (A) negative   pH, UA 7.0 5.0 - 8.0   POC PROTEIN,UA =100 (A) negative, trace   Urobilinogen, UA 0.2 0.2 or 1.0 E.U./dL   Nitrite, UA Negative Negative   Leukocytes, UA Negative Negative  Patient should be on Jardiance 25 mg daily and metformin XR 1 g twice a day. Says he may forget to take metformin 1000mg  medication occasionally (as recently as last night), but upon further inquiry he claims to be taking it consistently most of the time. Checks BS before eating and says it runs 110-160 but never goes above 200. Rarely reading is below 80.  He thinks he is doing well with his eating habits.  Still jogging 3x wk with wgt training.    2.  Hypertension associated with diabetes Still taking lisinopril 20 mg daily. Limiting salt in diet. No chest pains or shortness of breath  3. Acquired hypothyroidism Still taking levothyroxine 75 mcg daily. Denies weight changes.   4. HLD Still taking Lipitor 10 mg daily  5. HIV Taking Dovato as prescribed by ID.   6. HM Due for 2nd shingles vaccine today.    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   HIV disease (HCC) 02/08/2020   Lung nodules 01/04/2020   Pruritic rash 03/16/2018   Anemia 12/27/2017   Hyperproteinemia 07/05/2017   Hypoalbuminemia 07/05/2017   Positive for macroalbuminuria 11/21/2016   Elevated liver enzymes 08/21/2016   Benign essential hypertension 08/21/2016   Hyperlipidemia associated with type 2 diabetes mellitus (HCC) 08/21/2016   Type 2 diabetes mellitus (HCC) 08/21/2016   Blurring of visual image 08/21/2016     Current Outpatient Medications on File Prior to Visit  Medication Sig Dispense Refill   Alcohol Swabs PADS Clean fingers/equipments prior to CBG testing of your fingers 100 each 11   atorvastatin (LIPITOR) 10 MG tablet Take 1 tablet (10 mg total) by mouth  daily. 90 tablet 3   Blood Glucose Monitoring Suppl (TRUE METRIX METER) w/Device KIT Use to check blood sugar once daily. 1 kit 0   empagliflozin (JARDIANCE) 25 MG TABS tablet Take 1 tablet (25 mg total) by mouth daily before breakfast. 30 tablet 3   glucose blood (TRUE METRIX BLOOD GLUCOSE TEST) test strip Use to check blood sugar once daily. 100 each 3   levothyroxine (SYNTHROID) 50 MCG tablet TAKE 1 AND 1/2 TABLET BY MOUTH DAILY BEFORE BREAKFAST 135 tablet 0   lisinopril (ZESTRIL) 20 MG tablet Take 1 tablet (20 mg total) by mouth daily. 90  tablet 3   Omega-3 Fatty Acids (FISH OIL PO) Take by mouth.     TRUEplus Lancets 28G MISC Use to check blood sugar once daily. 100 each 3   dolutegravir-lamiVUDine (DOVATO) 50-300 MG tablet TAKE 1 TABLET BY MOUTH DAILY (Patient not taking: Reported on 11/17/2022) 30 tablet 5   gabapentin (NEURONTIN) 100 MG capsule Take 1 capsule (100 mg total) by mouth 3 (three) times daily. (Patient not taking: Reported on 05/02/2020) 90 capsule 2   hydrocortisone (ANUSOL-HC) 2.5 % rectal cream  (Patient not taking: Reported on 11/17/2022)     No current facility-administered medications on file prior to visit.    No Known Allergies  Social History   Socioeconomic History   Marital status: Single    Spouse name: Not on file   Number of children: Not on file   Years of education: Not on file   Highest education level: Not on file  Occupational History   Not on file  Tobacco Use   Smoking status: Never   Smokeless tobacco: Never  Substance and Sexual Activity   Alcohol use: Not Currently   Drug use: Never   Sexual activity: Yes    Partners: Female    Comment: accepted condoms  Other Topics Concern   Not on file  Social History Narrative   Not on file   Social Determinants of Health   Financial Resource Strain: Not on file  Food Insecurity: Not on file  Transportation Needs: Not on file  Physical Activity: Not on file  Stress: Not on file  Social Connections: Not on file  Intimate Partner Violence: Not on file    Family History  Problem Relation Age of Onset   Diabetes Mother    Diabetes Brother     No past surgical history on file.  ROS: Review of Systems  Constitutional:  Negative for unexpected weight change.  HENT:  Negative for hearing loss.   Eyes:  Negative for visual disturbance.  Endocrine: Negative for polyuria.  Neurological:  Negative for numbness.    PHYSICAL EXAM: BP 128/73 (BP Location: Left Arm, Patient Position: Sitting, Cuff Size: Normal)   Pulse 90    Temp 98.9 F (37.2 C) (Oral)   Ht 5\' 1"  (1.549 m)   Wt 139 lb (63 kg)   SpO2 97%   BMI 26.26 kg/m     Physical Exam Constitutional:      Appearance: Normal appearance. He is not toxic-appearing.  Cardiovascular:     Rate and Rhythm: Normal rate and regular rhythm.     Comments: Trace edema in b/l legs  Pulmonary:     Effort: Pulmonary effort is normal.     Breath sounds: Normal breath sounds.  Neurological:     Mental Status: He is alert.    Diabetic Foot Exam - Simple   Simple Foot Form Diabetic Foot exam was performed  with the following findings: Yes 11/17/2022 11:03 AM  Visual Inspection See comments: Yes Sensation Testing Intact to touch and monofilament testing bilaterally: Yes Pulse Check Posterior Tibialis and Dorsalis pulse intact bilaterally: Yes Comments Nonulcerative callous lateral to LT 5th toe.  Shaved callous in same area of RT 5th toe     Results for orders placed or performed in visit on 11/17/22  POCT glucose (manual entry)  Result Value Ref Range   POC Glucose 426 (A) 70 - 99 mg/dl  POCT glycosylated hemoglobin (Hb A1C)  Result Value Ref Range   Hemoglobin A1C     HbA1c POC (<> result, manual entry)     HbA1c, POC (prediabetic range)     HbA1c, POC (controlled diabetic range) 10.0 (A) 0.0 - 7.0 %  POCT URINALYSIS DIP (CLINITEK)  Result Value Ref Range   Color, UA yellow yellow   Clarity, UA clear clear   Glucose, UA >=1,000 (A) negative mg/dL   Bilirubin, UA negative negative   Ketones, POC UA negative negative mg/dL   Spec Grav, UA 1.610 9.604 - 1.025   Blood, UA trace-intact (A) negative   pH, UA 7.0 5.0 - 8.0   POC PROTEIN,UA =100 (A) negative, trace   Urobilinogen, UA 0.2 0.2 or 1.0 E.U./dL   Nitrite, UA Negative Negative   Leukocytes, UA Negative Negative        Latest Ref Rng & Units 08/10/2022    9:05 AM 01/16/2022    9:34 AM 07/25/2021   10:13 AM  CMP  Glucose 65 - 99 mg/dL 540  981  191   BUN 7 - 25 mg/dL 23  19  28     Creatinine 0.70 - 1.30 mg/dL 4.78  2.95  6.21   Sodium 135 - 146 mmol/L 133  137  137   Potassium 3.5 - 5.3 mmol/L 4.1  3.9  4.4   Chloride 98 - 110 mmol/L 100  102  104   CO2 20 - 32 mmol/L 26  28  27    Calcium 8.6 - 10.3 mg/dL 8.8  9.0  9.9   Total Protein 6.1 - 8.1 g/dL 7.2  7.3  8.4   Total Bilirubin 0.2 - 1.2 mg/dL 0.4  0.5  0.5   AST 10 - 35 U/L 21  22  23    ALT 9 - 46 U/L 19  20  17     Lipid Panel     Component Value Date/Time   CHOL 150 01/16/2022 0934   CHOL 144 01/13/2021 0859   TRIG 142 01/16/2022 0934   HDL 41 01/16/2022 0934   HDL 38 (L) 01/13/2021 0859   CHOLHDL 3.7 01/16/2022 0934   LDLCALC 85 01/16/2022 0934    CBC    Component Value Date/Time   WBC 4.6 08/10/2022 0905   RBC 4.46 08/10/2022 0905   HGB 14.3 08/10/2022 0905   HCT 42.5 08/10/2022 0905   PLT 137 (L) 08/10/2022 0905   MCV 95.3 08/10/2022 0905   MCH 32.1 08/10/2022 0905   MCHC 33.6 08/10/2022 0905   RDW 13.5 08/10/2022 0905   LYMPHSABS 1,130 01/16/2022 0934   MONOABS 0.9 11/20/2019 0246   EOSABS 50 01/16/2022 0934   BASOSABS 18 01/16/2022 0934   Lab Results  Component Value Date   TSH 2.390 07/16/2022     ASSESSMENT AND PLAN:  1. Type 2 diabetes mellitus with diabetic microalbuminuria, without long-term current use of insulin (HCC) 2. Diabetes mellitus treated with oral medication (HCC) Uncontrolled. Patient reports being  fully compliant with metformin 1000mg  BID and Jardiance 25 mg a day; though I suspect this is not entirely true as the last refill metformin over 1 month ago. Long acting insulin recommended to be added today but pt declines. He also declines GLP-1 agonist.  He is agreeable to adding another oral agent.  We will add Amaryl 2 mg daily Advised patient to check BS twice a day before breakfast and dinner. Advised on healthy diet.  Follow up with Clinical pharmacist in 1 month.  - POCT glucose (manual entry) - POCT glycosylated hemoglobin (Hb A1C) - POCT URINALYSIS  DIP (CLINITEK) - metFORMIN (GLUCOPHAGE-XR) 500 MG 24 hr tablet; Take 2 tablets (1,000 mg total) by mouth 2 (two) times daily with a meal.  Dispense: 120 tablet; Refill: 3 - glimepiride (AMARYL) 2 MG tablet; Take 1 tablet (2 mg total) by mouth daily before breakfast.  Dispense: 90 tablet; Refill: 1   3. Hypertension associated with diabetes (HCC) At goal. Continue Lisinopril 20mg  daily.  4. Hyperlipidemia associated with type 2 diabetes mellitus (HCC) Continue Lipitor 20 mg daily.  5. Acquired hypothyroidism TSH from 07/16/2022 was 2.390, normal.  Continue levothyroxine 75 mg daily.   6. AIDS (acquired immune deficiency syndrome) (HCC) Continue taking Dovato as prescribed by Infectious Disease.  7. Need for shingles vaccine - Zoster Vaccine Adjuvanted [facility]) injection; Inject 0.5 mLs into the muscle once for 1 dose.  Dispense: 0.5 mL; Refill: 0    Patient was given the opportunity to ask questions.  Patient verbalized understanding of the plan and was able to repeat key elements of the plan.   Orders Placed This Encounter  Procedures   POCT glucose (manual entry)   POCT glycosylated hemoglobin (Hb A1C)   POCT URINALYSIS DIP (CLINITEK)     Requested Prescriptions   Signed Prescriptions Disp Refills   Zoster Vaccine Adjuvanted (SHINGRIX) injection 0.5 mL 0    Sig: Inject 0.5 mLs into the muscle once for 1 dose.   metFORMIN (GLUCOPHAGE-XR) 500 MG 24 hr tablet 120 tablet 3    Sig: Take 2 tablets (1,000 mg total) by mouth 2 (two) times daily with a meal.   glimepiride (AMARYL) 2 MG tablet 90 tablet 1    Sig: Take 1 tablet (2 mg total) by mouth daily before breakfast.    Return in about 4 months (around 03/20/2023) for Appt with [facility] in 4 wks for BS check.

## 2022-11-18 NOTE — Progress Notes (Signed)
Patient ID: Manuel Hayes, male    DOB: 1970-06-18  MRN: [id number]  CC: Diabetes (DM f/u. Med refills. /No questions / concerns. Valentino Hue to shingles vax. )   Subjective: Manuel Hayes is a 52 y.o. 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      [employer] Language interpreter used during this encounter. #[id number].    1. Type 2 diabetes mellitus with microalbuminuria, without long-term current use of insulin (HCC) Results for orders placed or performed in visit on 11/17/22  POCT glucose (manual entry)  Result Value Ref Range   POC Glucose 426 (A) 70 - 99 mg/dl  POCT glycosylated hemoglobin (Hb A1C)  Result Value Ref Range   Hemoglobin A1C     HbA1c POC (<> result, manual entry)     HbA1c, POC (prediabetic range)     HbA1c, POC (controlled diabetic range) 10.0 (A) 0.0 - 7.0 %  POCT URINALYSIS DIP (CLINITEK)  Result Value Ref Range   Color, UA yellow yellow   Clarity, UA clear clear   Glucose, UA >=1,000 (A) negative mg/dL   Bilirubin, UA negative negative   Ketones, POC UA negative negative mg/dL   Spec Grav, UA 2.130 8.657 - 1.025   Blood, UA trace-intact (A) negative   pH, UA 7.0 5.0 - 8.0   POC PROTEIN,UA =100 (A) negative, trace   Urobilinogen, UA 0.2 0.2 or 1.0 E.U./dL   Nitrite, UA Negative Negative   Leukocytes, UA Negative Negative  Patient should be on Jardiance 25 mg daily and metformin XR 1 g twice a day. Says he may forget to take metformin 1000mg  medication occasionally (as recently as last night), but upon further inquiry he claims to be taking it consistently most of the time. Checks BS before eating and says it runs 110-160 but never goes above 200. Rarely reading is below 80.  He thinks he is doing well with his eating habits.  Still jogging 3x wk with wgt training.    2.  Hypertension associated with diabetes Still taking lisinopril 20 mg daily. Limiting salt in diet. No chest pains or shortness of breath  3. Acquired hypothyroidism Still taking levothyroxine 75 mcg daily. Denies weight changes.   4. HLD Still taking Lipitor 10 mg daily  5. HIV Taking Dovato as prescribed by ID.   6. HM Due for 2nd shingles vaccine today.    Patient Active Problem List   Diagnosis Date Noted  . Need for tetanus, diphtheria, and acellular pertussis (Tdap) vaccine 09/06/2020  . Hypertension associated with diabetes (HCC) 09/06/2020  . Acquired hypothyroidism 09/06/2020  . Herpes zoster 04/22/2020  . 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 HIV disease (HCC) 02/08/2020  . Lung nodules 01/04/2020  . Pruritic rash 03/16/2018  . Anemia 12/27/2017  . Hyperproteinemia 07/05/2017  . Hypoalbuminemia 07/05/2017  . Positive for macroalbuminuria 11/21/2016  . Elevated liver enzymes 08/21/2016  . Benign essential hypertension 08/21/2016  . Hyperlipidemia associated with type 2 diabetes mellitus (HCC) 08/21/2016  . Type 2 diabetes mellitus (HCC) 08/21/2016  . Blurring of visual image 08/21/2016     Current Outpatient Medications on File Prior to Visit  Medication Sig Dispense Refill  . Alcohol Swabs PADS Clean fingers/equipments prior to CBG testing of your fingers 100 each 11  . atorvastatin (LIPITOR) 10 MG tablet Take 1 tablet (10 mg total) by mouth  daily. 90 tablet 3  . Blood Glucose Monitoring Suppl (TRUE METRIX METER) w/Device KIT Use to check blood sugar once daily. 1 kit 0  . empagliflozin (JARDIANCE) 25 MG TABS tablet Take 1 tablet (25 mg total) by mouth daily before breakfast. 30 tablet 3  . glucose blood (TRUE METRIX BLOOD GLUCOSE TEST) test strip Use to check blood sugar once daily. 100 each 3  . levothyroxine (SYNTHROID) 50 MCG tablet TAKE 1 AND 1/2 TABLET BY MOUTH DAILY BEFORE BREAKFAST 135 tablet 0  . lisinopril (ZESTRIL) 20 MG tablet Take 1 tablet (20 mg  total) by mouth daily. 90 tablet 3  . Omega-3 Fatty Acids (FISH OIL PO) Take by mouth.    . TRUEplus Lancets 28G MISC Use to check blood sugar once daily. 100 each 3  . dolutegravir-lamiVUDine (DOVATO) 50-300 MG tablet TAKE 1 TABLET BY MOUTH DAILY (Patient not taking: Reported on 11/17/2022) 30 tablet 5  . gabapentin (NEURONTIN) 100 MG capsule Take 1 capsule (100 mg total) by mouth 3 (three) times daily. (Patient not taking: Reported on 05/02/2020) 90 capsule 2  . hydrocortisone (ANUSOL-HC) 2.5 % rectal cream  (Patient not taking: Reported on 11/17/2022)     No current facility-administered medications on file prior to visit.    No Known Allergies  Social History   Socioeconomic History  . Marital status: Single    Spouse name: Not on file  . Number of children: Not on file  . Years of education: Not on file  . Highest education level: Not on file  Occupational History  . Not on file  Tobacco Use  . Smoking status: Never  . Smokeless tobacco: Never  Substance and Sexual Activity  . Alcohol use: Not Currently  . Drug use: Never  . Sexual activity: Yes    Partners: Female    Comment: accepted condoms  Other Topics Concern  . Not on file  Social History Narrative  . Not on file   Social Determinants of Health   Financial Resource Strain: Not on file  Food Insecurity: Not on file  Transportation Needs: Not on file  Physical Activity: Not on file  Stress: Not on file  Social Connections: Not on file  Intimate Partner Violence: Not on file    Family History  Problem Relation Age of Onset  . Diabetes Mother   . Diabetes Brother     History reviewed. No pertinent surgical history.  ROS: Review of Systems  Constitutional:  Negative for unexpected weight change.  HENT:  Negative for hearing loss.   Eyes:  Negative for visual disturbance.  Endocrine: Negative for polyuria.  Neurological:  Negative for numbness.    PHYSICAL EXAM: BP 128/73 (BP Location: Left Arm,  Patient Position: Sitting, Cuff Size: Normal)   Pulse 90   Temp 98.9 F (37.2 C) (Oral)   Ht 5\' 1"  (1.549 m)   Wt 139 lb (63 kg)   SpO2 97%   BMI 26.26 kg/m     Physical Exam Constitutional:      Appearance: Normal appearance. He is not toxic-appearing.  Cardiovascular:     Rate and Rhythm: Normal rate and regular rhythm.     Comments: Trace edema in b/l legs  Pulmonary:     Effort: Pulmonary effort is normal.     Breath sounds: Normal breath sounds.  Neurological:     Mental Status: He is alert.   Diabetic Foot Exam - Simple   Simple Foot Form Diabetic Foot exam was performed with  the following findings: Yes 11/17/2022 11:03 AM  Visual Inspection See comments: Yes Sensation Testing Intact to touch and monofilament testing bilaterally: Yes Pulse Check Posterior Tibialis and Dorsalis pulse intact bilaterally: Yes Comments Nonulcerative callous lateral to LT 5th toe.  Shaved callous in same area of RT 5th toe     Results for orders placed or performed in visit on 11/17/22  POCT glucose (manual entry)  Result Value Ref Range   POC Glucose 426 (A) 70 - 99 mg/dl  POCT glycosylated hemoglobin (Hb A1C)  Result Value Ref Range   Hemoglobin A1C     HbA1c POC (<> result, manual entry)     HbA1c, POC (prediabetic range)     HbA1c, POC (controlled diabetic range) 10.0 (A) 0.0 - 7.0 %  POCT URINALYSIS DIP (CLINITEK)  Result Value Ref Range   Color, UA yellow yellow   Clarity, UA clear clear   Glucose, UA >=1,000 (A) negative mg/dL   Bilirubin, UA negative negative   Ketones, POC UA negative negative mg/dL   Spec Grav, UA 7.829 5.621 - 1.025   Blood, UA trace-intact (A) negative   pH, UA 7.0 5.0 - 8.0   POC PROTEIN,UA =100 (A) negative, trace   Urobilinogen, UA 0.2 0.2 or 1.0 E.U./dL   Nitrite, UA Negative Negative   Leukocytes, UA Negative Negative        Latest Ref Rng & Units 08/10/2022    9:05 AM 01/16/2022    9:34 AM 07/25/2021   10:13 AM  CMP  Glucose 65 - 99  mg/dL 308  657  846   BUN 7 - 25 mg/dL 23  19  28    Creatinine 0.70 - 1.30 mg/dL 9.62  9.52  8.41   Sodium 135 - 146 mmol/L 133  137  137   Potassium 3.5 - 5.3 mmol/L 4.1  3.9  4.4   Chloride 98 - 110 mmol/L 100  102  104   CO2 20 - 32 mmol/L 26  28  27    Calcium 8.6 - 10.3 mg/dL 8.8  9.0  9.9   Total Protein 6.1 - 8.1 g/dL 7.2  7.3  8.4   Total Bilirubin 0.2 - 1.2 mg/dL 0.4  0.5  0.5   AST 10 - 35 U/L 21  22  23    ALT 9 - 46 U/L 19  20  17     Lipid Panel     Component Value Date/Time   CHOL 150 01/16/2022 0934   CHOL 144 01/13/2021 0859   TRIG 142 01/16/2022 0934   HDL 41 01/16/2022 0934   HDL 38 (L) 01/13/2021 0859   CHOLHDL 3.7 01/16/2022 0934   LDLCALC 85 01/16/2022 0934    CBC    Component Value Date/Time   WBC 4.6 08/10/2022 0905   RBC 4.46 08/10/2022 0905   HGB 14.3 08/10/2022 0905   HCT 42.5 08/10/2022 0905   PLT 137 (L) 08/10/2022 0905   MCV 95.3 08/10/2022 0905   MCH 32.1 08/10/2022 0905   MCHC 33.6 08/10/2022 0905   RDW 13.5 08/10/2022 0905   LYMPHSABS 1,130 01/16/2022 0934   MONOABS 0.9 11/20/2019 0246   EOSABS 50 01/16/2022 0934   BASOSABS 18 01/16/2022 0934   Lab Results  Component Value Date   TSH 2.390 07/16/2022     ASSESSMENT AND PLAN:  1. Type 2 diabetes mellitus with diabetic microalbuminuria, without long-term current use of insulin (HCC) 2. Diabetes mellitus treated with oral medication (HCC) Uncontrolled. Patient reports being fully  compliant with metformin 1000mg  BID and Jardiance 25 mg a day; though I suspect this is not entirely true as the last refill metformin over 1 month ago. Long acting insulin recommended to be added today but pt declines. He also declines GLP-1 agonist.  He is agreeable to adding another oral agent.  We will add Amaryl 2 mg daily Advised patient to check BS twice a day before breakfast and dinner. Advised on healthy diet.  Follow up with Clinical pharmacist in 1 month.  - POCT glucose (manual entry) - POCT  glycosylated hemoglobin (Hb A1C) - POCT URINALYSIS DIP (CLINITEK) - metFORMIN (GLUCOPHAGE-XR) 500 MG 24 hr tablet; Take 2 tablets (1,000 mg total) by mouth 2 (two) times daily with a meal.  Dispense: 120 tablet; Refill: 3 - glimepiride (AMARYL) 2 MG tablet; Take 1 tablet (2 mg total) by mouth daily before breakfast.  Dispense: 90 tablet; Refill: 1   3. Hypertension associated with diabetes (HCC) At goal. Continue Lisinopril 20mg  daily.  4. Hyperlipidemia associated with type 2 diabetes mellitus (HCC) Continue Lipitor 20 mg daily.  5. Acquired hypothyroidism TSH from 07/16/2022 was 2.390, normal.  Continue levothyroxine 75 mg daily.   6. AIDS (acquired immune deficiency syndrome) (HCC) Continue taking Dovato as prescribed by Infectious Disease.  7. Need for shingles vaccine - Zoster Vaccine Adjuvanted [facility]) injection; Inject 0.5 mLs into the muscle once for 1 dose.  Dispense: 0.5 mL; Refill: 0   Evaluation and management procedures were performed by me with Medical Student in attendance, note written by Medical Student under my supervision and collaboration. I have reviewed the note and I agree with the management and plan and have assigned appropriate billing level.   Jonah Blue, MD, FAAFP. [facility] [place], [place] [phone]   11/18/2022, 8:39 AM  Patient was given the opportunity to ask questions.  Patient verbalized understanding of the plan and was able to repeat key elements of the plan.   Orders Placed This Encounter  Procedures  . POCT glucose (manual entry)  . POCT glycosylated hemoglobin (Hb A1C)  . POCT URINALYSIS DIP (CLINITEK)     Requested Prescriptions   Signed Prescriptions Disp Refills  . Zoster Vaccine Adjuvanted [facility]) injection 0.5 mL 0    Sig: Inject 0.5 mLs into the muscle once for 1 dose.  . metFORMIN (GLUCOPHAGE-XR) 500 MG 24 hr tablet 120 tablet 3    Sig: Take 2 tablets (1,000 mg total) by mouth  2 (two) times daily with a meal.  . glimepiride (AMARYL) 2 MG tablet 90 tablet 1    Sig: Take 1 tablet (2 mg total) by mouth daily before breakfast.    Return in about 4 months (around 03/20/2023) for Appt with [facility] in 4 wks for BS check.

## 2022-11-30 ENCOUNTER — Ambulatory Visit: Payer: Self-pay

## 2022-11-30 ENCOUNTER — Other Ambulatory Visit: Payer: Self-pay

## 2022-12-01 ENCOUNTER — Ambulatory Visit: Payer: Self-pay | Attending: Internal Medicine | Admitting: Pharmacist

## 2022-12-01 ENCOUNTER — Encounter: Payer: Self-pay | Admitting: Pharmacist

## 2022-12-01 DIAGNOSIS — Z7984 Long term (current) use of oral hypoglycemic drugs: Secondary | ICD-10-CM

## 2022-12-01 DIAGNOSIS — R809 Proteinuria, unspecified: Secondary | ICD-10-CM

## 2022-12-01 DIAGNOSIS — E1129 Type 2 diabetes mellitus with other diabetic kidney complication: Secondary | ICD-10-CM

## 2022-12-01 NOTE — Progress Notes (Signed)
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 male who presents for diabetes evaluation, education, and management.  PMH is significant for T2DM w/ albuminuria, HTN, HLD, acquired hypothyroidism, HIV (tx'd with Dovato), pulmonary TB. Patient was referred and last seen by Primary Care Provider, Dr. Laural Benes, on 11/17/22. At that visit, A1c was 10%. Glimepiride was added as patient refused insulin or GLP-1 RA agents.   Today, patient arrives in good spirits and presents with the assistance of an online interpretor. XF#[id number] Sine. At his last visit with Dr. Laural Benes, he was adamant that he was adherent to medications. However, there seemed to be non-adherence reflected by his pharmacy fill records.  Patient reports Diabetes was diagnosed in 2016. He was dx'd in a clinic. He has no known history of clinical ASCVD. No hx of CHF. No hx of CKD, however, he does has albuminuria. No known hx of pancreatitis, thyroid cancer. Of note, he is tolerating the glimepiride and his fill records now appear up to date. He tells me today he was working the night shift prior to that appt with Dr. Laural Benes on 7/2. This lead to suboptimal adherence and dietary indiscretion.  Family/Social History:  Fhx: DM Tobacco: never smoker Alcohol: none reported   Current diabetes medications include: glimepiride 2 mg daily, metformin 500 mg XR (2 tablets BID), Jardiance 25 mg  Patient reports adherence to taking all medications as prescribed.   Insurance coverage: none  Patient denies hypoglycemic events.  Patient denies nocturia (nighttime urination). 2x/night Patient denies neuropathy (nerve pain). Patient denies visual changes. Patient reports self foot exams.   Patient reported dietary habits: Eats 3 meals/day -Tells me he eats lots of beans, lentils, vegetables,  -Drinks: no sugar-sweetened beverages   Patient-reported exercise habits:  -Active at his job -Some weight lifting, running, walking   O:  7/2: 420 7/3:  400 7/4: 360, 345 7/5: 300, 345 7/6: 250, 235 7/7: 220, 210 7/8: 200, 220 7/9: 190, 170 7/10: 180, 185 7/11: 160, 170 7/12: 175, 169 7/13: 160, 150 7/14: 155, 155 7/15: 157, 160 7/16: 157    Lab Results  Component Value Date   HGBA1C 10.0 (A) 11/17/2022   There were no vitals filed for this visit.  Lipid Panel     Component Value Date/Time   CHOL 150 01/16/2022 0934   CHOL 144 01/13/2021 0859   TRIG 142 01/16/2022 0934   HDL 41 01/16/2022 0934   HDL 38 (L) 01/13/2021 0859   CHOLHDL 3.7 01/16/2022 0934   LDLCALC 85 01/16/2022 0934    Clinical Atherosclerotic Cardiovascular Disease (ASCVD): No  The 10-year ASCVD risk score (Arnett DK, et al., 2019) is: 9.6%   Values used to calculate the score:     Age: 89 years     Sex: Male     Is Non-Hispanic African American: No     Diabetic: Yes     Tobacco smoker: No     Systolic Blood Pressure: 128 mmHg     Is BP treated: Yes     HDL Cholesterol: 41 mg/dL     Total Cholesterol: 150 mg/dL   A/P: Diabetes longstanding currently uncontrolled. Home sugar log reveals improvement since starting glimepiride. Unfortunately, we are limited to PO therapy as the patient adamantly refuses injectable therapy. Patient is able to verbalize appropriate hypoglycemia management plan. Medication adherence seems to be better since his last PCP visit. -Continued current regimen.  -Adherence is our limiting factor here. Will see him back with  a home glucose log in 1 month and consider adjusting glimepiride at that time.  -Patient educated on purpose, proper use, and potential adverse effects of metformin, Jardiance, and glimepiride.  -Extensively discussed pathophysiology of diabetes, recommended lifestyle interventions, dietary effects on blood sugar control.  -Counseled on s/sx of and management of hypoglycemia.  -Next A1c 10/24.  Written patient instructions provided. Patient verbalized understanding of treatment plan.  Total time in face  to face counseling 30 minutes.    Follow-up:  Pharmacist in 1 month.  Butch Penny, PharmD, [place]y [place], CPP Clinical Pharmacist [facility] & [facility] [phone]

## 2023-01-01 ENCOUNTER — Other Ambulatory Visit: Payer: Self-pay | Admitting: Internal Medicine

## 2023-01-01 ENCOUNTER — Encounter: Payer: Self-pay | Admitting: Pharmacist

## 2023-01-01 ENCOUNTER — Other Ambulatory Visit: Payer: Self-pay

## 2023-01-01 ENCOUNTER — Ambulatory Visit: Payer: Self-pay | Attending: Family Medicine | Admitting: Pharmacist

## 2023-01-01 ENCOUNTER — Telehealth: Payer: Self-pay

## 2023-01-01 DIAGNOSIS — B20 Human immunodeficiency virus [HIV] disease: Secondary | ICD-10-CM

## 2023-01-01 DIAGNOSIS — Z7984 Long term (current) use of oral hypoglycemic drugs: Secondary | ICD-10-CM

## 2023-01-01 DIAGNOSIS — E1129 Type 2 diabetes mellitus with other diabetic kidney complication: Secondary | ICD-10-CM

## 2023-01-01 DIAGNOSIS — R809 Proteinuria, unspecified: Secondary | ICD-10-CM

## 2023-01-01 DIAGNOSIS — E039 Hypothyroidism, unspecified: Secondary | ICD-10-CM

## 2023-01-01 MED ORDER — GLIMEPIRIDE 4 MG PO TABS
4.0000 mg | ORAL_TABLET | Freq: Every day | ORAL | 1 refills | Status: DC
  Filled 2023-01-01: qty 30, 30d supply, fill #0
  Filled 2023-05-10: qty 30, 30d supply, fill #1

## 2023-01-01 MED ORDER — LEVOTHYROXINE SODIUM 50 MCG PO TABS
75.0000 ug | ORAL_TABLET | Freq: Every day | ORAL | 1 refills | Status: AC
Start: 2023-01-01 — End: ?
  Filled 2023-01-01: qty 45, 30d supply, fill #0
  Filled 2023-05-10: qty 45, 30d supply, fill #1
  Filled 2023-08-24: qty 45, 30d supply, fill #2
  Filled 2023-10-04: qty 45, 30d supply, fill #3
  Filled 2023-11-08: qty 45, 30d supply, fill #4
  Filled 2023-12-29: qty 45, 30d supply, fill #5

## 2023-01-01 NOTE — Progress Notes (Signed)
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 male who presents for diabetes evaluation, education, and management.  PMH is significant for T2DM w/ albuminuria, HTN, HLD, acquired hypothyroidism, HIV (tx'd with Dovato), pulmonary TB. Patient was referred and last seen by Primary Care Provider, Dr. Laural Benes, on 11/17/22. At that visit, A1c was 10%. Glimepiride was added as patient refused insulin or GLP-1 RA agents. I saw him on 12/01/2022 and noted improvement in home CBG control.   Today, patient arrives in good spirits and presents with the assistance of an online interpretor. [id number], [place]. Pt reports doing well since last visit with me. He is adherent with his medications and brings in sugar readings from home for review.   Family/Social History:  Fhx: DM Tobacco: never smoker Alcohol: none reported   Current diabetes medications include: glimepiride 2 mg daily, metformin 500 mg XR (2 tablets BID), Jardiance 25 mg  Patient reports adherence to taking all medications as prescribed.   Insurance coverage: none  Patient denies hypoglycemic events.  Patient denies nocturia (nighttime urination). 2x/night Patient denies neuropathy (nerve pain). Patient denies visual changes. Patient reports self foot exams.   Patient reported dietary habits: Eats 3 meals/day -Tells me he eats lots of beans, lentils, vegetables,  -Drinks: no sugar-sweetened beverages   Patient-reported exercise habits:  -Active at his job -Some weight lifting, running, walking   O:  Data over the past 15 days: Am PM 153 158 150 155 150 160 155 150 135 141 128 130 120 127 115 123 120 130 135 140 138 150 147 151 150  160 159 170 175 170 150  142.5 [phone]  Lab Results  Component Value Date   HGBA1C 10.0 (A) 11/17/2022   There were no vitals filed for this visit.  Lipid Panel     Component Value Date/Time   CHOL 150 01/16/2022 0934   CHOL 144 01/13/2021 0859   TRIG 142 01/16/2022 0934   HDL  41 01/16/2022 0934   HDL 38 (L) 01/13/2021 0859   CHOLHDL 3.7 01/16/2022 0934   LDLCALC 85 01/16/2022 0934    Clinical Atherosclerotic Cardiovascular Disease (ASCVD): No  The 10-year ASCVD risk score (Arnett DK, et al., 2019) is: 9.6%   Values used to calculate the score:     Age: 41 years     Sex: Male     Is Non-Hispanic African American: No     Diabetic: Yes     Tobacco smoker: No     Systolic Blood Pressure: 128 mmHg     Is BP treated: Yes     HDL Cholesterol: 41 mg/dL     Total Cholesterol: 150 mg/dL   A/P: Diabetes longstanding currently uncontrolled. Home sugar log reveals improvement since starting glimepiride. Unfortunately, we are limited to PO therapy as the patient adamantly refuses injectable therapy. Patient is able to verbalize appropriate hypoglycemia management plan. Medication adherence seems to be better since his last PCP visit. -Continued metformin and Jardiance at current doses.  -Increase glimepiride to 4 mg daily. -Patient educated on purpose, proper use, and potential adverse effects of metformin, Jardiance, and glimepiride.  -Extensively discussed pathophysiology of diabetes, recommended lifestyle interventions, dietary effects on blood sugar control.  -Counseled on s/sx of and management of hypoglycemia.  -Next A1c 10/24.  Written patient instructions provided. Patient verbalized understanding of treatment plan.  Total time in face to face counseling 30 minutes.    Follow-up:  Pharmacist in 1 month.  Butch Penny, PharmD, BCACP,  CPP Clinical Pharmacist [facility] & [facility] [phone]

## 2023-01-01 NOTE — Telephone Encounter (Signed)
Okay for me to reorder Bactrim? It was d/c'd by PCP, but patient's most recent CD4 from March was only 92. He sees you again end of September.   Thanks,  Meg

## 2023-01-01 NOTE — Telephone Encounter (Signed)
Patient's PCP discontinued Bactrim. Per Dr. Renold Don, patient needs to be on Bactrim as CD4 count is 92. Prescription refilled, called patient to make sure he's still taking it, no answer. Interpreter left message requesting call back.   [employer] [place] [id number]  Sandie Ano, RN

## 2023-01-04 NOTE — Telephone Encounter (Signed)
Spoke with patient with assistance from [facility] Sue Lush [phone]) and counseled patient that he should remain on Bactrim. Notified him that refill has been sent and reminded him of appointment in September.   Sandie Ano, RN

## 2023-02-05 ENCOUNTER — Ambulatory Visit: Payer: Self-pay | Admitting: Pharmacist

## 2023-02-11 ENCOUNTER — Other Ambulatory Visit: Payer: Self-pay

## 2023-02-11 ENCOUNTER — Ambulatory Visit (INDEPENDENT_AMBULATORY_CARE_PROVIDER_SITE_OTHER): Payer: Self-pay | Admitting: Internal Medicine

## 2023-02-11 ENCOUNTER — Encounter: Payer: Self-pay | Admitting: Internal Medicine

## 2023-02-11 VITALS — BP 118/73 | HR 75 | Temp 98.2°F | Wt 140.0 lb

## 2023-02-11 DIAGNOSIS — A150 Tuberculosis of lung: Secondary | ICD-10-CM

## 2023-02-11 DIAGNOSIS — Z23 Encounter for immunization: Secondary | ICD-10-CM

## 2023-02-11 DIAGNOSIS — B20 Human immunodeficiency virus [HIV] disease: Secondary | ICD-10-CM

## 2023-02-11 DIAGNOSIS — A159 Respiratory tuberculosis unspecified: Secondary | ICD-10-CM

## 2023-02-11 NOTE — Progress Notes (Signed)
Chief complaint: f/u pulm TB and hiv care  Subjective:    Patient ID: Manuel Hayes, male    DOB: May 12, 1971, 52 y.o.   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 He was changed from rifampin to rifabutin for preparation of starting ART.  -he doesn't know how he got hiv. No hx ivdu. Heterosexual. Prior married in [place]. Wife in [place] -blurriness in his eyes on visit 01/2020, so not started on ART yet -transaminitis with labs but no frank dress/hepatitis -also presumed to have oropharyngeal thrush (he reported dry mouth)  -- given nystatin spit/squish  Patient gets DOT with his TB meds. He has labs monitored along with recent sputum  He still has some dry mouth. He denies headache, visual blurriness, eye pain, visual loss  He is eating well and gaining weight  No n/v/rash, joint pain, diarrhea.   dovato started 10/14  meds reviewed: Dovato His home dm2 meds TB meds   04/02/20 id f/u Doing well, gaining weight (110 --> 130 pounds) Feeling well, no complaint Cough much improved Not seeing his PCP at [facility] as afraid he can't afford medications. Off and on lovastatin, metformin, glipizide, and lisinopril and levothyroxine Taking dovato; not taking bactrim Still getting DOT for tb meds. Finished with intensive therapy 2 months on 10/30. He is on b6, rifabutin, and inh. No side effect   12/16 id f/u HIV no missed dose pulm tb -- on DOT. No concern. He doesn't know when his last sputum testing was Patient still haven't seen his pcp at [facility] (financial) He brings his pill box today dovato Bactrim Nystatin  liquid Lisinopril Metformin Valacyclovir (he is supposed to be on synthroid, lovastatin, and glipizide as well); last sugar check was very high He in the mean time developed: 1) right eye pain/redness 3 days; no f/c or decreased vision. He has an eye doctor next visit 05/2020. He is on tb treatment on continuation phase now. His last cxr 03/2020 showed scarring left lower lobe. No f/c/nightsweat/cough/weight loss. Previously has some blurry vision but initial ophthalmologic visit no concern for infection; serologic w/u syphilis/toxo/cmv negative. No penile rash/discharge 2) recently seen by urgent care for right thigh shingles given valacyclovir; has 1 more pill to take. Lesions had scabbed over; pain much better  Labs to be done today   05/31/2020 id f/u He was referred and finally saw internal medicine clinic 1/07 at [facility] for primary care He returns today for tb/hiv care [place]ingle had resolved Never saw ophthalmology (awaiting financial aid), however eye redness/discomfort resolved No f/c/n/v/diarrhea No cough Reviewed labs. Platelet has been at just above 50 Getting DOT for tb tx Compliant no missing dose of dovato  07/24/20 id f/u Complain of bilateral hand pain in the ip joints for about 1-2 month and also ankle/toes. These are most pronounced in the heels and the fingers. No wrist, knees, elbows or back pain. Some shoulder pain No skin rash No fever, chill Pain stable, 8/10. Constant daily.  No swelling Sx improves as the day goes along. Sx worst in the morning, and after prolonged rest including stiffness No family hx rheumatoid arthritis or lupus or autoimmune  disease No fatigue, oral/genital ulcer, new visual changes/blurriness/pain No dysuria/penile discharge   08/23/20 id f/u Patient is back here today for ongoing joint pain (wrist/hands, hip, knees/heels, elbows) Finished TB treatment already on 4/06 No fever, chill Good/normal energy level No  depression/insomnia/anxiety We review labs from last visit --> crp, lft normal; wbc normal; platelet at 100s He is on bid tivicay and daily descovy right now due to tb meds    10/02/20 Doing well Bilateral hands pain chronic No f/c/n/v/diarrhea Takes dovato everyday no missed dose  Hx via language interpreter   01/03/2021 Hx via language interpreter Patient without complaint No f/c/nightsweat/weight loss/decreased appetite His joint pain hurts a lot less now in hands and left shoulder. It hurts more at night not particularly at rest.  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 Renting a room from a friend No sexual relationship or encounter Wife in [place], haven't met in more than 5 years; he sends money to her   07/25/2021 id clinic follow up Tolerating dovato [place]iel he is taking 2 medications prescribed by [place] and other medictions from medicine clinic He is adamant that he is not missing any dose of dovato He takes it before his food (breakfast) He doesn't take any vitamins/minerals rx'ed or over the counter Labs done 07/15/21 reviewed Lab Results  Component Value Date   CD4TCELL 12 (L) 08/10/2022   CD4TABS 92 (L) 08/10/2022  Cd4 105 (stagnant around 7-8% the last 2 years) Rpr nonreactive; hep c ab and hep b sag/core ab/sAb nonreactive Hiv rna 32; one time undetected 10/02/20 and 07/24/20 other than that low level viremia but <100 since 04/2020 (tx started 01/2020)  Good appetite No weight loss No cough; no night sweat/f/c  Denies sexual encounter since (although he was taking condoms with him). He wants std screen. He has sex with women only   01/29/22 id clinic f/u Social: working painting for house renovation; lives with a roommate; no smoking/street drug use; not sure when last sexual encounter was but denies currently being sexually  active  No missed dose dovato the last 4 weeks. Continues on bactrim prophy  Sees internal medicine for his htn/dm2/hypothyroidism  He has no complaint or health concern today    08/10/22 id clinic f/u Doing very well. No complaint No missed dose of bactrim/dovato Also takes atorvastatin for hyperlipidemia, rx'ed by his pcp No cough Eating well No weight loss, f/c/nightsweat Not sexually active -- doesn't want std testing Working still doing Holiday representative work    02/11/23 id clinic f/u Doing very well On dovato/bactrim -- no missed dose last 4 weeks Not on insulin  Social -- no change since last visit (working at [employer], not sexually active)  He hasn't had colon cancer screening yet     ROS: All other ros negative   Past Medical History:  Diagnosis Date   HIV disease (HCC) 02/08/2020   Hyperlipidemia    Pulmonary nodule    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  Family History  Problem Relation Age of Onset   Diabetes Mother    Diabetes Brother       Social History   Socioeconomic History   Marital status: Single    Spouse name: Not on file   Number of children: Not on file   Years  of education: Not on file   Highest education level: Not on file  Occupational History   Not on file  Tobacco Use   Smoking status: Never   Smokeless tobacco: Never  Substance and Sexual Activity   Alcohol use: Not Currently   Drug use: Never   Sexual activity: Yes    Partners: Female    Comment: accepted condoms  Other Topics Concern   Not on file  Social History Narrative   Not on file   Social Determinants of Health   Financial Resource Strain: Not on file  Food Insecurity: Not on file  Transportation Needs: Not on file  Physical Activity: Not on file  Stress: Not on file  Social Connections: Not on file    No Known Allergies   Current Outpatient Medications:    atorvastatin (LIPITOR) 10 MG tablet, Take 1  tablet (10 mg total) by mouth daily., Disp: 90 tablet, Rfl: 3   Blood Glucose Monitoring Suppl (TRUE METRIX METER) w/Device KIT, Use to check blood sugar once daily., Disp: 1 kit, Rfl: 0   empagliflozin (JARDIANCE) 25 MG TABS tablet, Take 1 tablet (25 mg total) by mouth daily before breakfast., Disp: 30 tablet, Rfl: 3   glimepiride (AMARYL) 4 MG tablet, Take 1 tablet (4 mg total) by mouth daily before breakfast., Disp: 90 tablet, Rfl: 1   glucose blood (TRUE METRIX BLOOD GLUCOSE TEST) test strip, Use to check blood sugar once daily., Disp: 100 each, Rfl: 3   levothyroxine (SYNTHROID) 50 MCG tablet, Take 1.5 tablets (75 mcg total) by mouth daily before breakfast., Disp: 135 tablet, Rfl: 1   lisinopril (ZESTRIL) 20 MG tablet, Take 1 tablet (20 mg total) by mouth daily., Disp: 90 tablet, Rfl: 3   metFORMIN (GLUCOPHAGE-XR) 500 MG 24 hr tablet, Take 2 tablets (1,000 mg total) by mouth 2 (two) times daily with a meal., Disp: 120 tablet, Rfl: 3   Omega-3 Fatty Acids (FISH OIL PO), Take by mouth., Disp: , Rfl:    sulfamethoxazole-trimethoprim (BACTRIM DS) 800-160 MG tablet, TAKE 1 TABLET BY MOUTH 3 TIMES A WEEK, Disp: 36 tablet, Rfl: 2   TRUEplus Lancets 28G MISC, Use to check blood sugar once daily., Disp: 100 each, Rfl: 3   Alcohol Swabs PADS, Clean fingers/equipments prior to CBG testing of your fingers, Disp: 100 each, Rfl: 11   dolutegravir-lamiVUDine (DOVATO) 50-300 MG tablet, TAKE 1 TABLET BY MOUTH DAILY (Patient not taking: Reported on 11/17/2022), Disp: 30 tablet, Rfl: 5   gabapentin (NEURONTIN) 100 MG capsule, Take 1 capsule (100 mg total) by mouth 3 (three) times daily. (Patient not taking: Reported on 05/02/2020), Disp: 90 capsule, Rfl: 2   hydrocortisone (ANUSOL-HC) 2.5 % rectal cream, , Disp: , Rfl:       Objective:    Vitals reviewed Vitals:   02/11/23 0835  BP: 118/73  Pulse: 75  Temp: 98.2 F (36.8 C)  SpO2: 98%    General/constitutional: no distress, pleasant HEENT:  Normocephalic, PER, Conj Clear, EOMI, Oropharynx clear Neck supple CV: rrr no mrg Lungs: clear to auscultation, normal respiratory effort Abd: Soft, Nontender Ext: no edema Skin: No Rash Neuro: nonfocal MSK: no peripheral joint swelling/tenderness/warmth; back spines nontender       Labs: Lab Results  Component Value Date   WBC 4.6 08/10/2022   HGB 14.3 08/10/2022   HCT 42.5 08/10/2022   MCV 95.3 08/10/2022   PLT 137 (L) 08/10/2022   Last metabolic panel Lab Results  Component Value Date   GLUCOSE  167 (H) 08/10/2022   NA 133 (L) 08/10/2022   K 4.1 08/10/2022   CL 100 08/10/2022   CO2 26 08/10/2022   BUN 23 08/10/2022   CREATININE 1.19 08/10/2022   GFRNONAA 63 07/24/2020   GFRAA 73 07/24/2020   CALCIUM 8.8 08/10/2022   PROT 7.2 08/10/2022   ALBUMIN 2.2 (L) 11/20/2019   BILITOT 0.4 08/10/2022   ALKPHOS 331 (H) 11/20/2019   AST 21 08/10/2022   ALT 19 08/10/2022   ANIONGAP 6 11/20/2019    3/09 cr 1.3; lft normal; cbc 4/13/103; tsh 4.43; a1c 6.3; ana negative; rf/ccp negative 3/09 lipid 180; 36; 103; 310  Serology: 06/2021 rpr negative; hepatis acute panel nr  01/2020 toxoplasma IgG positive; hep b sAg/sAb/cAb NR; hep C ab negative; Hep A ab reactive; cmv IgM and hsv1&2 IgM negative; vzv igg positive 01/2020 urine gc/chlam pcr negative 01/2020 rpr negative  hiv labs        cd4 (%)  /  VL 9/23              50 (3%) / 197k 11/16                          / 373 12/16           166 (6)    /  66 05/31/20                     /  25 07/2020        129 (8)   /  <20 04/29                          /  47 05/18                          /  undetectable 12/19/20                     /  50s 06/2021        107 (7)  /   30s 01/2022        100 (9)  /   <20  Imaging: 11/16 chest xray left lung scarring  08/21/20 chest xray Redemonstration of nodularity of the left lung base, unchanged from prior chest x-ray and CT, with no evidence of acute cardiopulmonary disease       Assessment & Plan:    #Pulmonary TB: #thrombocytopenia, meds induced Started ATB tx first week of 01/2020. Finished intensive tx 10/29.  AFB sputum surveillance ngative on 9/5, 6, 19, 20, 10/3, 4, 17, 18.  03/2020 cxr residual scarring LLL, no cavity Developed thrombocytopenia into 50s, thought to be related to rifampin, but improved Finished tb meds 4/6. Platelet recovered while on tb meds  -No sign of active infection/relapse as of 02/10/23 -- no b sx, cough fatigue   #AIDS/HIV disease: started on dovato 02/29/2020. No prior therapy (new dx and referred to our clinic). No evidence Hepatitis B. Genotype 02/08/2020 no RTI or PI resistance (mutations -- RT a98S, v118I, R211k; PR I13v, d60e, l63p, v77i). He is doing very well on Dovato, and very compliant.   Continues to do well on dovato, compliant. No missed dose last 4 weeks      -discussed u=u -encourage compliance -continue current HIV medication -labs today -f/u in 9 months    #HCM -vaccination Will discuss prevnar, meningococcal, tdap  next visit Flu vaccination today 02/11/23 -hepatitis b No hx hep b exposure/vaccination Will start when cd4 >200 -std screening 01/2022 rpr and urine gc/chlamydia negative Not sexually active and asked to be deferred -hx tb. No b sx as of 02/11/23 -cancer screening Advise patient to discuss with pcp to get colonoscopy for screening colorectal cancer         Raymondo Band, MD [facility] [phone]  pager   [phone] cell 02/11/2023, 9:04 AM

## 2023-02-11 NOTE — Patient Instructions (Signed)
Flu vaccine today    Please do 30 minute follow up in 9 months, so we can discuss other vaccinations more thoroughly   Continue dovato and bactrim   Talk to your primary care doctor about setting you up with a colonoscopy to screen for colon cancer

## 2023-02-13 LAB — HIV-1 RNA QUANT-NO REFLEX-BLD
HIV 1 RNA Quant: 42 {copies}/mL — ABNORMAL HIGH
HIV-1 RNA Quant, Log: 1.62 {Log} — ABNORMAL HIGH

## 2023-03-22 ENCOUNTER — Ambulatory Visit: Payer: Self-pay | Admitting: Internal Medicine

## 2023-04-26 ENCOUNTER — Other Ambulatory Visit: Payer: Self-pay | Admitting: Internal Medicine

## 2023-04-26 DIAGNOSIS — B20 Human immunodeficiency virus [HIV] disease: Secondary | ICD-10-CM

## 2023-05-10 ENCOUNTER — Other Ambulatory Visit: Payer: Self-pay

## 2023-05-24 ENCOUNTER — Other Ambulatory Visit: Payer: Self-pay

## 2023-05-24 ENCOUNTER — Ambulatory Visit: Payer: Self-pay | Attending: Internal Medicine | Admitting: Internal Medicine

## 2023-05-24 ENCOUNTER — Encounter: Payer: Self-pay | Admitting: Internal Medicine

## 2023-05-24 VITALS — BP 118/60 | HR 86 | Temp 99.3°F | Ht 61.0 in | Wt 137.0 lb

## 2023-05-24 DIAGNOSIS — E039 Hypothyroidism, unspecified: Secondary | ICD-10-CM

## 2023-05-24 DIAGNOSIS — E1159 Type 2 diabetes mellitus with other circulatory complications: Secondary | ICD-10-CM

## 2023-05-24 DIAGNOSIS — Z23 Encounter for immunization: Secondary | ICD-10-CM

## 2023-05-24 DIAGNOSIS — E1129 Type 2 diabetes mellitus with other diabetic kidney complication: Secondary | ICD-10-CM

## 2023-05-24 DIAGNOSIS — R079 Chest pain, unspecified: Secondary | ICD-10-CM

## 2023-05-24 DIAGNOSIS — R809 Proteinuria, unspecified: Secondary | ICD-10-CM

## 2023-05-24 DIAGNOSIS — I152 Hypertension secondary to endocrine disorders: Secondary | ICD-10-CM

## 2023-05-24 DIAGNOSIS — Z7984 Long term (current) use of oral hypoglycemic drugs: Secondary | ICD-10-CM

## 2023-05-24 DIAGNOSIS — E119 Type 2 diabetes mellitus without complications: Secondary | ICD-10-CM

## 2023-05-24 LAB — GLUCOSE, POCT (MANUAL RESULT ENTRY): POC Glucose: 174 mg/dL — AB (ref 70–99)

## 2023-05-24 LAB — POCT GLYCOSYLATED HEMOGLOBIN (HGB A1C): HbA1c, POC (controlled diabetic range): 11.4 % — AB (ref 0.0–7.0)

## 2023-05-24 MED ORDER — GLIMEPIRIDE 4 MG PO TABS
4.0000 mg | ORAL_TABLET | Freq: Two times a day (BID) | ORAL | 1 refills | Status: DC
  Filled 2023-05-24: qty 60, 30d supply, fill #0
  Filled 2023-08-24: qty 60, 30d supply, fill #1
  Filled 2023-11-08: qty 60, 30d supply, fill #2
  Filled 2023-12-29: qty 60, 30d supply, fill #3

## 2023-05-24 NOTE — Progress Notes (Signed)
 Patient ID: Manuel Hayes, male    DOB: Apr 17, 1971  MRN: [id number]  CC: Diabetes (DM f/u. /No questions / concerns/Yes to flu vax)   Subjective: Manuel Hayes is a 53 y.o. male who presents for chronic ds management. 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     [employer] Language interpreter used during this encounter. #Hon[place] [id number]  Discussed the use of AI scribe software for clinical note transcription with the patient, who gave verbal consent to proceed.  History of Present Illness   Mr. Manuel Hayes, a patient with a history of hypertension,hypothyroid and diabetes, presents for a routine follow-up.   HTN: He reports compliance with his current medication regimen, lisinopril  20 mg for hypertension  However, he does not regularly monitor his blood pressure at home. He has been experiencing occasional chest pain, which he describes as slight and started 2 wks ago; occur at rest or with movement; no radiation down arm.  Occurs over LT side of chest. Does not smoke.  DM Results for orders placed or performed in visit on 05/24/23  POCT glucose (manual entry)   Collection Time: 05/24/23  4:40 PM  Result Value Ref Range   POC Glucose 174 (A) 70 - 99 mg/dl  POCT glycosylated hemoglobin (Hb A1C)   Collection Time: 05/24/23  4:46 PM  Result Value Ref Range   Hemoglobin A1C     HbA1c POC (<> result, manual entry)     HbA1c, POC (prediabetic range)     HbA1c, POC (controlled diabetic range) 11.4 (A) 0.0 - 7.0 %    He also reports that he checks his blood sugar levels intermittently, usually when he feels unwell or believes his blood sugar is high. His readings are typically not over 200. Compliant with Metformin  1 gram BID and Amaryl  4 mg daily He expresses reluctance to start insulin  therapy, preferring to continue with his current oral medications.   He also mentions that he is taking medication for cholesterol consistently and thyroid  issues.      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   HIV disease (HCC) 02/08/2020   Lung nodules 01/04/2020   Pruritic rash 03/16/2018   Anemia 12/27/2017   Hyperproteinemia 07/05/2017   Hypoalbuminemia 07/05/2017   Positive for macroalbuminuria 11/21/2016   Elevated liver enzymes 08/21/2016   Benign essential hypertension 08/21/2016   Hyperlipidemia associated with type 2 diabetes mellitus (HCC) 08/21/2016   Type 2 diabetes mellitus (HCC) 08/21/2016   Blurring of visual image 08/21/2016     Current Outpatient Medications on File Prior to Visit  Medication Sig Dispense Refill   Alcohol  Swabs  PADS Clean fingers/equipments prior to CBG testing of your fingers 100 each 11   atorvastatin  (LIPITOR) 10 MG tablet Take 1 tablet (10 mg total) by mouth daily. 90 tablet 3   Blood Glucose Monitoring Suppl (TRUE METRIX METER) w/Device KIT Use to check blood sugar once daily. 1 kit 0   DOVATO  50-300 MG tablet TAKE 1 TABLET BY MOUTH DAILY 30 tablet 6   glucose blood (TRUE METRIX BLOOD GLUCOSE TEST) test strip Use to check blood sugar once daily. 100 each 3   levothyroxine  (SYNTHROID ) 50  MCG tablet Take 1.5 tablets (75 mcg total) by mouth daily before breakfast. 135 tablet 1   lisinopril  (ZESTRIL ) 20 MG tablet Take 1 tablet (20 mg total) by mouth daily. 90 tablet 3   metFORMIN  (GLUCOPHAGE -XR) 500 MG 24 hr tablet Take 2 tablets (1,000 mg total) by mouth 2 (two) times daily with a meal. 120 tablet 3   Omega-3 Fatty Acids (FISH OIL PO) Take by mouth.     TRUEplus Lancets 28G MISC Use to check blood sugar once daily. 100 each 3   gabapentin  (NEURONTIN ) 100 MG  capsule Take 1 capsule (100 mg total) by mouth 3 (three) times daily. (Patient not taking: Reported on 05/02/2020) 90 capsule 2   hydrocortisone (ANUSOL-HC) 2.5 % rectal cream  (Patient not taking: Reported on 05/24/2023)     sulfamethoxazole -trimethoprim  (BACTRIM  DS) 800-160 MG tablet TAKE 1 TABLET BY MOUTH 3 TIMES A WEEK (Patient not taking: Reported on 05/24/2023) 36 tablet 2   No current facility-administered medications on file prior to visit.    No Known Allergies  Social History   Socioeconomic History   Marital status: Single    Spouse name: Not on file   Number of children: Not on file   Years of education: Not on file   Highest education level: Not on file  Occupational History   Not on file  Tobacco Use   Smoking status: Never   Smokeless tobacco: Never  Substance and Sexual Activity   Alcohol  use: Not Currently   Drug use: Never   Sexual activity: Yes    Partners: Female    Comment: accepted condoms  Other Topics Concern   Not on file  Social History Narrative   Not on file   Social Drivers of Health   Financial Resource Strain: High Risk (05/24/2023)   Overall Financial Resource Strain (CARDIA)    Difficulty of Paying Living Expenses: Very hard  Food Insecurity: Food Insecurity Present (05/24/2023)   Hunger Vital Sign    Worried About Running Out of Food in the Last Year: Sometimes true    Ran Out of Food in the Last Year: Never true  Transportation Needs: No Transportation Needs (05/24/2023)   PRAPARE - Administrator, Civil Service (Medical): No    Lack of Transportation (Non-Medical): No  Physical Activity: Insufficiently Active (05/24/2023)   Exercise Vital Sign    Days of Exercise per Week: 3 days    Minutes of Exercise per Session: 30 min  Stress: Stress Concern Present (05/24/2023)   [employer] of Occupational Health - Occupational Stress Questionnaire    Feeling of Stress : To some extent  Social Connections: Moderately Integrated  (05/24/2023)   Social Connection and Isolation Panel [NHANES]    Frequency of Communication with Friends and Family: More than three times a week    Frequency of Social Gatherings with Friends and Family: Three times a week    Attends Religious Services: More than 4 times per year    Active Member of Clubs or Organizations: Yes    Attends Banker Meetings: More than 4 times per year    Marital Status: Separated  Intimate Partner Violence: Not At Risk (05/24/2023)   Humiliation, Afraid, Rape, and Kick questionnaire    Fear of Current or Ex-Partner: No    Emotionally Abused: No    Physically Abused: No    Sexually Abused: No    Family History  Problem Relation Age of Onset   Diabetes Mother  Diabetes Brother     History reviewed. No pertinent surgical history.  ROS: Review of Systems Negative except as stated above  PHYSICAL EXAM: BP 118/60   Pulse 86   Temp 99.3 F (37.4 C) (Oral)   Ht 5' 1 (1.549 m)   Wt 137 lb (62.1 kg)   SpO2 96%   BMI 25.89 kg/m   Physical Exam   General appearance - alert, well appearing, and in no distress Mental status - normal mood, behavior, speech, dress, motor activity, and thought processes Neck - supple, no significant adenopathy Chest - clear to auscultation, no wheezes, rales or rhonchi, symmetric air entry Heart - normal rate, regular rhythm, normal S1, S2, no murmurs, rubs, clicks or gallops Extremities - peripheral pulses normal, no pedal edema, no clubbing or cyanosis     Latest Ref Rng & Units 08/10/2022    9:05 AM 01/16/2022    9:34 AM 07/25/2021   10:13 AM  CMP  Glucose 65 - 99 mg/dL 832  811  860   BUN 7 - 25 mg/dL 23  19  28    Creatinine 0.70 - 1.30 mg/dL 8.80  8.94  8.77   Sodium 135 - 146 mmol/L 133  137  137   Potassium 3.5 - 5.3 mmol/L 4.1  3.9  4.4   Chloride 98 - 110 mmol/L 100  102  104   CO2 20 - 32 mmol/L 26  28  27    Calcium  8.6 - 10.3 mg/dL 8.8  9.0  9.9   Total Protein 6.1 - 8.1 g/dL 7.2  7.3   8.4   Total Bilirubin 0.2 - 1.2 mg/dL 0.4  0.5  0.5   AST 10 - 35 U/L 21  22  23    ALT 9 - 46 U/L 19  20  17     Lipid Panel     Component Value Date/Time   CHOL 150 01/16/2022 0934   CHOL 144 01/13/2021 0859   TRIG 142 01/16/2022 0934   HDL 41 01/16/2022 0934   HDL 38 (L) 01/13/2021 0859   CHOLHDL 3.7 01/16/2022 0934   LDLCALC 85 01/16/2022 0934    CBC    Component Value Date/Time   WBC 4.6 08/10/2022 0905   RBC 4.46 08/10/2022 0905   HGB 14.3 08/10/2022 0905   HCT 42.5 08/10/2022 0905   PLT 137 (L) 08/10/2022 0905   MCV 95.3 08/10/2022 0905   MCH 32.1 08/10/2022 0905   MCHC 33.6 08/10/2022 0905   RDW 13.5 08/10/2022 0905   LYMPHSABS 1,130 01/16/2022 0934   MONOABS 0.9 11/20/2019 0246   EOSABS 50 01/16/2022 0934   BASOSABS 18 01/16/2022 0934    ASSESSMENT AND PLAN: 1. Type 2 diabetes mellitus with diabetic microalbuminuria, without long-term current use of insulin  (HCC) (Primary) Not at goal and A1c has increased from previous. Discussed and encourage healthy eating habits. Recommends again that he considers daily long-acting insulin  but patient declines.  He is also not interested in GLP1 agonist We therefore agreed to increase Amaryl  to 4 mg twice a day. - POCT glycosylated hemoglobin (Hb A1C) - POCT glucose (manual entry) - CBC; Future - Comprehensive metabolic panel; Future - Lipid panel; Future - glimepiride  (AMARYL ) 4 MG tablet; Take 1 tablet (4 mg total) by mouth in the morning and at bedtime.  Dispense: 180 tablet; Refill: 1 - Microalbumin / creatinine urine ratio  2. Diabetes mellitus treated with oral medication (HCC) See #1 above  3. Hypertension associated with diabetes (HCC) At goal.  Continue lisinopril  20 mg daily  4. Chest pain in adult Patient has risk factors for heart disease.  Will refer to cardiology - Ambulatory referral to Cardiology  5. Acquired hypothyroidism Continue levothyroxine . - TSH; Future  6. Encounter for immunization -  Flu vaccine trivalent PF, 6mos and older(Flulaval,Afluria,Fluarix,Fluzone)     Patient was given the opportunity to ask questions.  Patient verbalized understanding of the plan and was able to repeat key elements of the plan.   This documentation was completed using Paediatric nurse.  Any transcriptional errors are unintentional.  Orders Placed This Encounter  Procedures   Flu vaccine trivalent PF, 6mos and older(Flulaval,Afluria,Fluarix,Fluzone)   CBC   Comprehensive metabolic panel   Lipid panel   TSH   Microalbumin / creatinine urine ratio   Ambulatory referral to Cardiology   POCT glycosylated hemoglobin (Hb A1C)   POCT glucose (manual entry)     Requested Prescriptions   Signed Prescriptions Disp Refills   glimepiride  (AMARYL ) 4 MG tablet 180 tablet 1    Sig: Take 1 tablet (4 mg total) by mouth in the morning and at bedtime.    Return in about 4 months (around 09/21/2023).  Barnie Louder, MD, FACP

## 2023-05-27 ENCOUNTER — Other Ambulatory Visit: Payer: Self-pay | Admitting: Internal Medicine

## 2023-05-27 DIAGNOSIS — E1159 Type 2 diabetes mellitus with other circulatory complications: Secondary | ICD-10-CM

## 2023-05-27 LAB — MICROALBUMIN / CREATININE URINE RATIO
Creatinine, Urine: 244.5 mg/dL
Microalb/Creat Ratio: 756 mg/g{creat} — ABNORMAL HIGH (ref 0–29)
Microalbumin, Urine: 1847.2 ug/mL

## 2023-05-27 MED ORDER — LISINOPRIL 30 MG PO TABS
30.0000 mg | ORAL_TABLET | Freq: Every day | ORAL | 1 refills | Status: DC
  Filled 2023-05-27: qty 30, 30d supply, fill #0
  Filled 2023-08-24: qty 30, 30d supply, fill #1
  Filled 2023-11-08: qty 30, 30d supply, fill #2
  Filled 2023-12-29: qty 30, 30d supply, fill #3

## 2023-05-28 ENCOUNTER — Other Ambulatory Visit: Payer: Self-pay

## 2023-05-31 ENCOUNTER — Ambulatory Visit: Payer: Self-pay | Attending: Internal Medicine

## 2023-05-31 ENCOUNTER — Other Ambulatory Visit: Payer: Self-pay

## 2023-05-31 DIAGNOSIS — E039 Hypothyroidism, unspecified: Secondary | ICD-10-CM

## 2023-05-31 DIAGNOSIS — R809 Proteinuria, unspecified: Secondary | ICD-10-CM

## 2023-06-01 LAB — LIPID PANEL
Chol/HDL Ratio: 3.5 {ratio} (ref 0.0–5.0)
Cholesterol, Total: 128 mg/dL (ref 100–199)
HDL: 37 mg/dL — ABNORMAL LOW (ref >39)
LDL Chol Calc (NIH): 60 mg/dL (ref 0–99)
Triglycerides: 184 mg/dL — ABNORMAL HIGH (ref 0–149)
VLDL Cholesterol Cal: 31 mg/dL (ref 5–40)

## 2023-06-01 LAB — CBC
Hematocrit: 42.1 % (ref 37.5–51.0)
Hemoglobin: 14 g/dL (ref 13.0–17.7)
MCH: 32.5 pg (ref 26.6–33.0)
MCHC: 33.3 g/dL (ref 31.5–35.7)
MCV: 98 fL — ABNORMAL HIGH (ref 79–97)
Platelets: 136 10*3/uL — ABNORMAL LOW (ref 150–450)
RBC: 4.31 x10E6/uL (ref 4.14–5.80)
RDW: 11.4 % — ABNORMAL LOW (ref 11.6–15.4)
WBC: 5.4 10*3/uL (ref 3.4–10.8)

## 2023-06-01 LAB — COMPREHENSIVE METABOLIC PANEL
ALT: 15 [IU]/L (ref 0–44)
AST: 14 [IU]/L (ref 0–40)
Albumin: 3.5 g/dL — ABNORMAL LOW (ref 3.8–4.9)
Alkaline Phosphatase: 149 [IU]/L — ABNORMAL HIGH (ref 44–121)
BUN/Creatinine Ratio: 22 — ABNORMAL HIGH (ref 9–20)
BUN: 24 mg/dL (ref 6–24)
Bilirubin Total: 0.3 mg/dL (ref 0.0–1.2)
CO2: 24 mmol/L (ref 20–29)
Calcium: 8.7 mg/dL (ref 8.7–10.2)
Chloride: 104 mmol/L (ref 96–106)
Creatinine, Ser: 1.09 mg/dL (ref 0.76–1.27)
Globulin, Total: 3.3 g/dL (ref 1.5–4.5)
Glucose: 168 mg/dL — ABNORMAL HIGH (ref 70–99)
Potassium: 4.5 mmol/L (ref 3.5–5.2)
Sodium: 139 mmol/L (ref 134–144)
Total Protein: 6.8 g/dL (ref 6.0–8.5)
eGFR: 81 mL/min/{1.73_m2} (ref >59)

## 2023-06-01 LAB — TSH: TSH: 4.14 u[IU]/mL (ref 0.450–4.500)

## 2023-06-08 ENCOUNTER — Telehealth: Payer: Self-pay | Admitting: Internal Medicine

## 2023-06-08 NOTE — Telephone Encounter (Signed)
Using Spanish interpreter Jesus # [phone], pt. Given lab results, verbalizes understanding.

## 2023-07-14 NOTE — Progress Notes (Deleted)
 Cardiology Office Note:    Date:  07/14/2023   ID:  Manuel Hayes, DOB January 17, 1971, MRN [id number]  PCP:  Marcine Matar, MD  Cardiologist:  None  Electrophysiologist:  None   Referring MD: Marcine Matar, MD   No chief complaint on file. ***  History of Present Illness:    Manuel Hayes is a 53 y.o. male with a hx of HIV, hyperlipidemia, hypertension, T2DM who is referred by Dr. Laural Benes for evaluation of chest pain.  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  Current Medications: No outpatient medications have been marked as taking for the 07/16/23 encounter (Appointment) with Little Ishikawa, MD.     Allergies:   Patient has no known allergies.   Social History   Socioeconomic History   Marital status: Single    Spouse name: Not on file   Number of children: Not on file   Years of education: Not on file   Highest education level: Not on file  Occupational History   Not on file  Tobacco Use   Smoking status: Never   Smokeless tobacco: Never  Substance and Sexual Activity   Alcohol use: Not Currently   Drug use: Never   Sexual activity: Yes    Partners: Female    Comment: accepted condoms  Other Topics Concern   Not on file  Social History Narrative   Not on file   Social Drivers of Health   Financial Resource Strain: High Risk (05/24/2023)   Overall Financial Resource Strain (CARDIA)    Difficulty of Paying Living Expenses: Very hard  Food Insecurity: Food Insecurity Present (05/24/2023)   Hunger Vital Sign    Worried About Running Out of Food in the Last Year: Sometimes true    Ran Out of Food in the Last Year: Never true  Transportation Needs: No Transportation Needs (05/24/2023)   PRAPARE - Administrator, Civil Service (Medical): No    Lack of Transportation  (Non-Medical): No  Physical Activity: Insufficiently Active (05/24/2023)   Exercise Vital Sign    Days of Exercise per Week: 3 days    Minutes of Exercise per Session: 30 min  Stress: Stress Concern Present (05/24/2023)   [employer] of Occupational Health - Occupational Stress Questionnaire    Feeling of Stress : To some extent  Social Connections: Moderately Integrated (05/24/2023)   Social Connection and Isolation Panel [NHANES]    Frequency of Communication with Friends and Family: More than three times a week    Frequency of Social Gatherings with Friends and Family: Three times a week    Attends Religious Services: More than 4 times per year    Active Member of Clubs or Organizations: Yes    Attends Engineer, structural: More than 4 times per year    Marital Status: Separated     Family History: The patient's ***family history includes Diabetes in his brother and mother.  ROS:   Please see the history of present illness.    *** All other systems reviewed and are negative.  EKGs/Labs/Other Studies Reviewed:    The following studies were reviewed today: ***  EKG:  EKG is *** ordered today.  The ekg ordered today demonstrates ***  Recent Labs: 05/31/2023: ALT 15; BUN 24; Creatinine, Ser 1.09; Hemoglobin 14.0; Platelets 136; Potassium  4.5; Sodium 139; TSH 4.140  Recent Lipid Panel    Component Value Date/Time   CHOL 128 05/31/2023 0842   TRIG 184 (H) 05/31/2023 0842   HDL 37 (L) 05/31/2023 0842   CHOLHDL 3.5 05/31/2023 0842   CHOLHDL 3.7 01/16/2022 0934   LDLCALC 60 05/31/2023 0842   LDLCALC 85 01/16/2022 0934    Physical Exam:    VS:  There were no vitals taken for this visit.    Wt Readings from Last 3 Encounters:  05/24/23 137 lb (62.1 kg)  02/11/23 140 lb (63.5 kg)  11/17/22 139 lb (63 kg)     GEN: *** Well nourished, well developed in no acute distress HEENT: Normal NECK: No JVD; No carotid bruits LYMPHATICS: No lymphadenopathy CARDIAC:  ***RRR, no murmurs, rubs, gallops RESPIRATORY:  Clear to auscultation without rales, wheezing or rhonchi  ABDOMEN: Soft, non-tender, non-distended MUSCULOSKELETAL:  No edema; No deformity  SKIN: Warm and dry NEUROLOGIC:  Alert and oriented x 3 PSYCHIATRIC:  Normal affect   ASSESSMENT:    No diagnosis found. PLAN:    Chest pain:  Hypertension: On lisinopril 30 mg daily  Hyperlipidemia: On atorvastatin 10 mg daily  T2DM: On glimepiride and metformin  HIV: On Dovato, follows with ID  RTC in***   Medication Adjustments/Labs and Tests Ordered: Current medicines are reviewed at length with the patient today.  Concerns regarding medicines are outlined above.  No orders of the defined types were placed in this encounter.  No orders of the defined types were placed in this encounter.   There are no Patient Instructions on file for this visit.   Signed, Little Ishikawa, MD  07/14/2023 3:33 PM    [facility]

## 2023-07-16 ENCOUNTER — Ambulatory Visit: Payer: Self-pay | Admitting: Cardiology

## 2023-08-24 ENCOUNTER — Other Ambulatory Visit: Payer: Self-pay

## 2023-08-24 ENCOUNTER — Other Ambulatory Visit: Payer: Self-pay | Admitting: Internal Medicine

## 2023-08-24 DIAGNOSIS — E1169 Type 2 diabetes mellitus with other specified complication: Secondary | ICD-10-CM

## 2023-08-24 MED ORDER — ATORVASTATIN CALCIUM 10 MG PO TABS
10.0000 mg | ORAL_TABLET | Freq: Every day | ORAL | 2 refills | Status: DC
  Filled 2023-08-24 – 2023-10-04 (×2): qty 90, 90d supply, fill #0

## 2023-09-02 ENCOUNTER — Other Ambulatory Visit: Payer: Self-pay

## 2023-09-21 ENCOUNTER — Ambulatory Visit: Payer: Self-pay | Admitting: Internal Medicine

## 2023-09-29 NOTE — Progress Notes (Signed)
 The ASCVD Risk score (Arnett DK, et al., 2019) failed to calculate for the following reasons:   The valid total cholesterol range is 130 to 320 mg/dL  Manuel Hayes, BSN, RN

## 2023-10-04 ENCOUNTER — Other Ambulatory Visit: Payer: Self-pay

## 2023-10-27 ENCOUNTER — Other Ambulatory Visit: Payer: Self-pay | Admitting: Internal Medicine

## 2023-10-27 DIAGNOSIS — B20 Human immunodeficiency virus [HIV] disease: Secondary | ICD-10-CM

## 2023-11-08 ENCOUNTER — Other Ambulatory Visit: Payer: Self-pay

## 2023-11-08 ENCOUNTER — Other Ambulatory Visit: Payer: Self-pay | Admitting: Internal Medicine

## 2023-11-08 DIAGNOSIS — E1129 Type 2 diabetes mellitus with other diabetic kidney complication: Secondary | ICD-10-CM

## 2023-11-10 ENCOUNTER — Other Ambulatory Visit: Payer: Self-pay | Admitting: Internal Medicine

## 2023-11-10 ENCOUNTER — Other Ambulatory Visit: Payer: Self-pay

## 2023-11-10 DIAGNOSIS — E1129 Type 2 diabetes mellitus with other diabetic kidney complication: Secondary | ICD-10-CM

## 2023-11-10 MED ORDER — METFORMIN HCL ER 500 MG PO TB24
1000.0000 mg | ORAL_TABLET | Freq: Two times a day (BID) | ORAL | 0 refills | Status: DC
Start: 2023-11-10 — End: 2023-12-29
  Filled 2023-11-10: qty 120, 30d supply, fill #0

## 2023-11-10 NOTE — Telephone Encounter (Signed)
 Copied from CRM [id number]. Topic: Clinical - Medication Refill >> Nov 10, 2023 11:48 AM DeAngela L wrote: Medication: metFORMIN  (GLUCOPHAGE -XR) 500 MG 24 hr tablet   Has the patient contacted their pharmacy? Yes  (Agent: If no, request that the patient contact the pharmacy for the refill. If patient does not wish to contact the pharmacy document the reason why and proceed with request.) (Agent: If yes, when and what did the pharmacy advise?)  This is the patient's preferred pharmacy:   [facility] [address] [place] [address] Phone: [phone] Fax: [phone]  Is this the correct pharmacy for this prescription? Yes  If no, delete pharmacy and type the correct one.   Has the prescription been filled recently? Yes   Is the patient out of the medication? Yes   Has the patient been seen for an appointment in the last year OR does the patient have an upcoming appointment? Yes   Can we respond through MyChart? No   Agent: Please be advised that Rx refills may take up to 3 business days. We ask that you follow-up with your pharmacy.

## 2023-11-11 ENCOUNTER — Other Ambulatory Visit: Payer: Self-pay

## 2023-11-11 ENCOUNTER — Encounter: Payer: Self-pay | Admitting: Internal Medicine

## 2023-11-11 ENCOUNTER — Telehealth: Payer: Self-pay

## 2023-11-11 ENCOUNTER — Other Ambulatory Visit: Payer: Self-pay | Admitting: Pharmacist

## 2023-11-11 ENCOUNTER — Ambulatory Visit (INDEPENDENT_AMBULATORY_CARE_PROVIDER_SITE_OTHER): Payer: Self-pay | Admitting: Internal Medicine

## 2023-11-11 ENCOUNTER — Other Ambulatory Visit (HOSPITAL_COMMUNITY): Payer: Self-pay

## 2023-11-11 VITALS — BP 124/74 | HR 87 | Resp 16 | Ht 61.0 in | Wt 137.0 lb

## 2023-11-11 DIAGNOSIS — A150 Tuberculosis of lung: Secondary | ICD-10-CM

## 2023-11-11 DIAGNOSIS — B20 Human immunodeficiency virus [HIV] disease: Secondary | ICD-10-CM

## 2023-11-11 DIAGNOSIS — E8809 Other disorders of plasma-protein metabolism, not elsewhere classified: Secondary | ICD-10-CM

## 2023-11-11 DIAGNOSIS — Z9189 Other specified personal risk factors, not elsewhere classified: Secondary | ICD-10-CM

## 2023-11-11 MED ORDER — ATORVASTATIN CALCIUM 40 MG PO TABS
40.0000 mg | ORAL_TABLET | Freq: Every day | ORAL | 11 refills | Status: AC
  Filled 2023-11-11 (×2): qty 30, 30d supply, fill #0
  Filled 2023-12-29: qty 30, 30d supply, fill #1
  Filled 2024-04-10 (×2): qty 90, 90d supply, fill #2

## 2023-11-11 MED ORDER — SULFAMETHOXAZOLE-TRIMETHOPRIM 400-80 MG PO TABS
1.0000 | ORAL_TABLET | Freq: Two times a day (BID) | ORAL | 11 refills | Status: AC
  Filled 2023-11-11 (×2): qty 60, 30d supply, fill #0
  Filled 2024-01-05 – 2024-01-26 (×3): qty 60, 30d supply, fill #1
  Filled 2024-04-10: qty 60, 30d supply, fill #2
  Filled 2024-06-14: qty 60, 30d supply, fill #3

## 2023-11-11 MED ORDER — ATORVASTATIN CALCIUM 40 MG PO TABS
40.0000 mg | ORAL_TABLET | Freq: Every day | ORAL | 11 refills | Status: DC
  Filled 2023-11-11: qty 30, 30d supply, fill #0

## 2023-11-11 MED ORDER — DOLUTEGRAVIR-LAMIVUDINE 50-300 MG PO TABS: 1.0000 | ORAL_TABLET | Freq: Every day | ORAL | 11 refills | Status: AC

## 2023-11-11 MED ORDER — DOVATO 50-300 MG PO TABS: 1.0000 | ORAL_TABLET | Freq: Every day | ORAL | Status: AC

## 2023-11-11 MED ORDER — SULFAMETHOXAZOLE-TRIMETHOPRIM 400-80 MG PO TABS
1.0000 | ORAL_TABLET | Freq: Two times a day (BID) | ORAL | 11 refills | Status: DC
  Filled 2023-11-11: qty 60, 30d supply, fill #0

## 2023-11-11 NOTE — Patient Instructions (Addendum)
 Blood test today     Continue dovato     Changes in medication: 1) bactrim  --> changed to lower dose and to take one tablet once a day 2) atorvastatin  --> increased dose 40 mg and take once a day    3 medications to pick up dovato , bactrim , and atorvastatin  at the local pharmacy and ask them to send home on future refills    See me in 9 months

## 2023-11-11 NOTE — Progress Notes (Signed)
 Medication Samples have been provided to the patient.  Drug name: Dovato         Strength: 50/300 mg         Qty: 28  Tablets (2 bottles) LOT: [id number]   Exp.Date: 7/26  Samples requested by Dr. Overton.  Dosing instructions: Take one tablet by mouth once daily  The patient has been instructed regarding the correct time, dose, and frequency of taking this medication, including desired effects and most common side effects.   Alan Geralds, PharmD, CPP, BCIDP, AAHIVP Clinical Pharmacist Practitioner Infectious Diseases Clinical Pharmacist [facility]

## 2023-11-11 NOTE — Telephone Encounter (Signed)
 Patient seen in office 11/11/23 and alerted us  that his address will change over the weekend. He does not know his new address yet. He was given samples today of Dovato and will come by [facility] on Wed to give front desk new address. He will need to alert Charmaine or [facility] to ship his RX once address is confirmed.  Advise him to answer calls from [facility] they need to confirm address before shipping also.   Attached [employer] as RICK.

## 2023-11-11 NOTE — Progress Notes (Signed)
 Chief complaint: f/u pulm TB and hiv care  Subjective:    Patient ID: Manuel Hayes, male    DOB: [birth year], 53 y.o.   MRN: [id number]  Cc: hiv care  HPI 53 yo hispanic speaking only male, dm2, hlp recently dx'ed with HIV and pulm TB on antituberculous medication, here for f/u  Hx is via him/Spanish interpreter     Background: ----------------- He was seen/established care with dr Fleeta Rothman initially at [facility](or at least to get on ART) -TB regimen started almost 2 months. He was changed from rifampin to rifabutin for preparation of starting ART.  -he doesn't know how he got hiv. No hx ivdu. Heterosexual. Prior married in [place]. 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 He has labs monitored along with recent sputum  He still has some dry mouth. He denies headache, visual blurriness, eye pain, visual loss  He is eating well and gaining weight  No n/v/rash, joint pain, diarrhea.   dovato  started 10/14  meds reviewed: Dovato  His home dm2 meds TB meds   04/02/20 id f/u Doing well, gaining weight (110 --> 130 pounds) Feeling well, no complaint Cough much improved Not seeing his PCP at [facility] as afraid he can't afford medications. Off and on lovastatin , metformin , glipizide , and lisinopril  and levothyroxine  Taking dovato ; not taking bactrim  Still getting DOT for tb meds. Finished with intensive therapy 2 months on 10/30. He is on b6, rifabutin, and inh. No side effect   12/16 id f/u HIV no missed dose pulm tb -- on DOT. No concern. He doesn't know when his last sputum testing was Patient still haven't seen his pcp at [facility] (financial) He brings his pill box today dovato  Bactrim  Nystatin   liquid Lisinopril  Metformin  Valacyclovir  (he is supposed to be on synthroid , lovastatin , and glipizide  as well); last sugar check was very high He in the mean time developed: 1) right eye pain/redness 3 days; no f/c or decreased vision. He has an eye doctor next visit 05/2020. He is on tb treatment on continuation phase now. His last cxr 03/2020 showed scarring left lower lobe. No f/c/nightsweat/cough/weight loss. Previously has some blurry vision but initial ophthalmologic visit no concern for infection; serologic w/u syphilis/toxo/cmv negative. No penile rash/discharge 2) recently seen by urgent care for right thigh shingles given valacyclovir ; has 1 more pill to take. Lesions had scabbed over; pain much better  Labs to be done today   05/31/2020 id f/u He was referred and finally saw internal medicine clinic 1/07 [facility] He returns today for tb/hiv care [place] had resolved Never saw ophthalmology (awaiting financial aid), however eye redness/discomfort resolved No f/c/n/v/diarrhea No cough Reviewed labs. Platelet has been at just above 50 Getting DOT for tb tx Compliant no missing dose of dovato   07/24/20 id f/u Complain of bilateral hand pain in the ip joints for about 1-2 month and also ankle/toes. These are most pronounced in the heels and the fingers. No wrist, knees, elbows or back pain. Some shoulder pain No skin rash No fever, chill Pain stable, 8/10. Constant daily.  No swelling Sx improves as the day goes along. Sx worst in the morning, and after prolonged rest including stiffness No family hx rheumatoid arthritis or lupus or autoimmune  disease No fatigue, oral/genital ulcer, new visual changes/blurriness/pain No dysuria/penile discharge   08/23/20 id f/u Patient is back here today for ongoing joint pain (wrist/hands, hip, knees/heels, elbows) Finished TB treatment already on 4/06 No fever, chill Good/normal energy level No  depression/insomnia/anxiety We review labs from last visit --> crp, lft normal; wbc normal; platelet at 100s He is on bid tivicay  and daily descovy right now due to tb meds    10/02/20 Doing well Bilateral hands pain chronic No f/c/n/v/diarrhea Takes dovato  everyday no missed dose  Hx via language interpreter   01/03/2021 Hx via language interpreter Patient without complaint No f/c/nightsweat/weight loss/decreased appetite His joint pain hurts a lot less now in hands and left shoulder. It hurts more at night not particularly at rest.  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 Renting a room from a friend No sexual relationship or encounter Wife in [place], haven't met in more than 5 years; he sends money to her   07/25/2021 id clinic follow up Tolerating dovato  [place] he is taking 2 medications prescribed by us  and other medictions from medicine clinic He is adamant that he is not missing any dose of dovato  He takes it before his food (breakfast) He doesn't take any vitamins/minerals rx'ed or over the counter Labs done 07/15/21 reviewed Cd4 105 (stagnant around 7-8% the last 2 years) Rpr nonreactive; hep c ab and hep b sag/core ab/sAb nonreactive Hiv rna 32; one time undetected 10/02/20 and 07/24/20 other than that low level viremia but <100 since 04/2020 (tx started 01/2020)  Good appetite No weight loss No cough; no night sweat/f/c  Denies sexual encounter since (although he was taking condoms with him). He wants std screen. He has sex with women only   01/29/22 id clinic f/u Social: working painting for house renovation; lives with a roommate; no smoking/street drug use; not sure when last sexual encounter was but denies currently being sexually active  No missed dose dovato  the last 4 weeks. Continues on bactrim  prophy  Sees internal medicine for  his htn/dm2/hypothyroidism  He has no complaint or health concern today    08/10/22 id clinic f/u Doing very well. No complaint No missed dose of bactrim /dovato  Also takes atorvastatin  for hyperlipidemia, rx'ed by his pcp No cough Eating well No weight loss, f/c/nightsweat Not sexually active -- doesn't want std testing Working still doing Holiday representative work    02/11/23 id clinic f/u Doing very well On dovato /bactrim  -- no missed dose last 4 weeks Not on insulin  Social -- no change since last visit (working at [employer], not sexually active)  He hasn't had colon cancer screening yet    11/11/23 id clinic f/u See a&p for detail Reviewed his meds bag Atorva 10 Dovato  Bactrim  ds 1 tab three times per week Metformin  1000 Synthroid  50 mcg Lisinporil 30 Glimepiride  4 He has no complaint today No missed dose dovato  the last 4 weeks       ROS: All other ros negative   Past Medical History:  Diagnosis Date   HIV disease (HCC) 02/08/2020   Hyperlipidemia    Pulmonary nodule    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 No pertinent surgical history.  Family History  Problem Relation Age of Onset   Diabetes Mother    Diabetes Brother  Social History   Socioeconomic History   Marital status: Single    Spouse name: Not on file   Number of children: Not on file   Years of education: Not on file   Highest education level: Not on file  Occupational History   Not on file  Tobacco Use   Smoking status: Never   Smokeless tobacco: Never  Substance and Sexual Activity   Alcohol  use: Not Currently   Drug use: Never   Sexual activity: Yes    Partners: Female    Comment: accepted condoms  Other Topics Concern   Not on file  Social History Narrative   Not on file   Social Drivers of Health   Financial Resource Strain: High Risk (05/24/2023)   Overall Financial Resource Strain (CARDIA)    Difficulty of Paying Living Expenses: Very  hard  Food Insecurity: Food Insecurity Present (05/24/2023)   Hunger Vital Sign    Worried About Running Out of Food in the Last Year: Sometimes true    Ran Out of Food in the Last Year: Never true  Transportation Needs: No Transportation Needs (05/24/2023)   PRAPARE - Administrator, Civil Service (Medical): No    Lack of Transportation (Non-Medical): No  Physical Activity: Insufficiently Active (05/24/2023)   Exercise Vital Sign    Days of Exercise per Week: 3 days    Minutes of Exercise per Session: 30 min  Stress: Stress Concern Present (05/24/2023)   [employer] of Occupational Health - Occupational Stress Questionnaire    Feeling of Stress : To some extent  Social Connections: Moderately Integrated (05/24/2023)   Social Connection and Isolation Panel    Frequency of Communication with Friends and Family: More than three times a week    Frequency of Social Gatherings with Friends and Family: Three times a week    Attends Religious Services: More than 4 times per year    Active Member of Clubs or Organizations: Yes    Attends Engineer, structural: More than 4 times per year    Marital Status: Separated    No Known Allergies   Current Outpatient Medications:    Alcohol  Swabs  PADS, Clean fingers/equipments prior to CBG testing of your fingers, Disp: 100 each, Rfl: 11   atorvastatin  (LIPITOR) 10 MG tablet, Take 1 tablet (10 mg total) by mouth daily., Disp: 90 tablet, Rfl: 2   Blood Glucose Monitoring Suppl (TRUE METRIX METER) w/Device KIT, Use to check blood sugar once daily., Disp: 1 kit, Rfl: 0   DOVATO  50-300 MG tablet, TAKE 1 TABLET BY MOUTH DAILY, Disp: 30 tablet, Rfl: 6   glimepiride  (AMARYL ) 4 MG tablet, Take 1 tablet (4 mg total) by mouth in the morning and at bedtime., Disp: 180 tablet, Rfl: 1   glucose blood (TRUE METRIX BLOOD GLUCOSE TEST) test strip, Use to check blood sugar once daily., Disp: 100 each, Rfl: 3   levothyroxine  (SYNTHROID ) 50 MCG tablet,  Take 1.5 tablets (75 mcg total) by mouth daily before breakfast., Disp: 135 tablet, Rfl: 1   lisinopril  (ZESTRIL ) 30 MG tablet, Take 1 tablet (30 mg total) by mouth daily., Disp: 90 tablet, Rfl: 1   metFORMIN  (GLUCOPHAGE -XR) 500 MG 24 hr tablet, Take 2 tablets (1,000 mg total) by mouth 2 (two) times daily with a meal., Disp: 120 tablet, Rfl: 0   Omega-3 Fatty Acids (FISH OIL PO), Take by mouth., Disp: , Rfl:    sulfamethoxazole -trimethoprim  (BACTRIM  DS) 800-160 MG tablet, TAKE 1 TABLET BY  MOUTH 3 TIMES A WEEK, Disp: 12 tablet, Rfl: 0   TRUEplus Lancets 28G MISC, Use to check blood sugar once daily., Disp: 100 each, Rfl: 3   gabapentin  (NEURONTIN ) 100 MG capsule, Take 1 capsule (100 mg total) by mouth 3 (three) times daily. (Patient not taking: Reported on 11/11/2023), Disp: 90 capsule, Rfl: 2   hydrocortisone (ANUSOL-HC) 2.5 % rectal cream, , Disp: , Rfl:       Objective:    Vitals reviewed Vitals:   11/11/23 0931  BP: 124/74  Pulse: 87  Resp: 16    General/constitutional: no distress, pleasant HEENT: Normocephalic, PER, Conj Clear, EOMI, Oropharynx clear Neck supple CV: rrr no mrg Lungs: clear to auscultation, normal respiratory effort Abd: Soft, Nontender Ext: no edema Skin: No Rash Neuro: nonfocal MSK: no peripheral joint swelling/tenderness/warmth; back spines nontender       Labs: Lab Results  Component Value Date   WBC 5.4 05/31/2023   HGB 14.0 05/31/2023   HCT 42.1 05/31/2023   MCV 98 (H) 05/31/2023   PLT 136 (L) 05/31/2023   Last metabolic panel Lab Results  Component Value Date   GLUCOSE 168 (H) 05/31/2023   NA 139 05/31/2023   K 4.5 05/31/2023   CL 104 05/31/2023   CO2 24 05/31/2023   BUN 24 05/31/2023   CREATININE 1.09 05/31/2023   GFRNONAA 63 07/24/2020   GFRAA 73 07/24/2020   CALCIUM  8.7 05/31/2023   PROT 6.8 05/31/2023   ALBUMIN 3.5 (L) 05/31/2023   LABGLOB 3.3 05/31/2023   BILITOT 0.3 05/31/2023   ALKPHOS 149 (H) 05/31/2023   AST 14  05/31/2023   ALT 15 05/31/2023   ANIONGAP 6 11/20/2019    3/09 cr 1.3; lft normal; cbc 4/13/103; tsh 4.43; a1c 6.3; ana negative; rf/ccp negative 3/09 lipid 180; 36; 103; 310  Serology: 06/2021 rpr negative; hepatis acute panel nr  01/2020 toxoplasma IgG positive; hep b sAg/sAb/cAb NR; hep C ab negative; Hep A ab reactive; cmv IgM and hsv1&2 IgM negative; vzv igg positive 01/2020 urine gc/chlam pcr negative 01/2020 rpr negative  hiv labs        cd4 (%)  /  VL 9/23              50 (3%) / 197k 11/16                          / 373 12/16           166 (6)    /  66 05/31/20                     /  25 07/2020        129 (8)   /  <20 04/29                          /  47 05/18                          /  undetectable 12/19/20                     /  50s 06/2021        107 (7)  /   30s 01/2022        100 (9)  /   <20 07/2022         92 (12) /   <  20 01/2023                      /   42  Imaging: 11/16 chest xray left lung scarring  08/21/20 chest xray Redemonstration of nodularity of the left lung base, unchanged from prior chest x-ray and CT, with no evidence of acute cardiopulmonary disease      Assessment & Plan:    #Pulmonary TB: #thrombocytopenia, meds induced Started ATB tx first week of 01/2020. Finished intensive tx 10/29.  AFB sputum surveillance ngative on 9/5, 6, 19, 20, 10/3, 4, 17, 18.  03/2020 cxr residual scarring LLL, no cavity Developed thrombocytopenia into 50s, thought to be related to rifampin, but improved Finished tb meds 4/6. Platelet recovered while on tb meds  -No sign of active infection/relapse as of 11/11/23 -- no b sx, cough fatigue   #AIDS/HIV disease: started on dovato  02/29/2020. No prior therapy (new dx and referred to our clinic). No evidence Hepatitis B. Genotype 02/08/2020 no RTI or PI resistance (mutations -- RT a98S, v118I, R211k; PR I13v, d60e, l63p, v77i). He is doing very well on Dovato , and very compliant.   11/11/23 Continues to do well on  dovato , compliant. No missed dose last 4 weeks   -discussed u=u -encourage compliance -continue current HIV medication -labs today -f/u in 9 months -continue bactrim  will change to ss daily   #11/11/23 Social - wife in [place]. Working same place fruit packing. Staying with friends. No smoking/drinking/drugs. Not sexually active    #HCM -vaccination Prevnar 11/11/23 Utd except meningococcal (will do next time) -hepatitis b No hx hep b exposure/vaccination; low risk Will start when cd4 >200 -std screening 01/2022 rpr and urine gc/chlamydia negative Not sexually active and asked to be deferred -hx tb. No b sx as of 02/11/23 -cancer screening Advise patient to discuss with pcp to get colonoscopy for screening colorectal cancer Chart forwarded to Barnie Vicci Constance ONEIDA Overton, MD [facility] 11/11/2023, 9:36 AM

## 2023-11-12 LAB — T-HELPER CELLS (CD4) COUNT (NOT AT ARMC)
CD4 % Helper T Cell: 13 % — ABNORMAL LOW (ref 33–65)
CD4 T Cell Abs: 73 /uL — ABNORMAL LOW (ref 400–1790)

## 2023-11-13 LAB — HIV-1 RNA QUANT-NO REFLEX-BLD
HIV 1 RNA Quant: 44 {copies}/mL — ABNORMAL HIGH
HIV-1 RNA Quant, Log: 1.64 {Log_copies}/mL — ABNORMAL HIGH

## 2023-11-17 ENCOUNTER — Other Ambulatory Visit (HOSPITAL_COMMUNITY): Payer: Self-pay

## 2023-11-17 ENCOUNTER — Other Ambulatory Visit: Payer: Self-pay

## 2023-11-17 NOTE — Progress Notes (Signed)
 This encounter was made in error patient is in the [employer] program and has a renewal appointment coming soon dis enrolling from [facility]. 11/17/23 - CNS

## 2023-11-22 ENCOUNTER — Ambulatory Visit: Payer: Self-pay

## 2023-11-22 ENCOUNTER — Ambulatory Visit: Payer: Self-pay | Admitting: Internal Medicine

## 2023-11-22 ENCOUNTER — Other Ambulatory Visit: Payer: Self-pay

## 2023-12-01 ENCOUNTER — Other Ambulatory Visit (HOSPITAL_COMMUNITY): Payer: Self-pay

## 2023-12-29 ENCOUNTER — Other Ambulatory Visit: Payer: Self-pay

## 2023-12-29 ENCOUNTER — Other Ambulatory Visit: Payer: Self-pay | Admitting: Internal Medicine

## 2023-12-29 DIAGNOSIS — E1129 Type 2 diabetes mellitus with other diabetic kidney complication: Secondary | ICD-10-CM

## 2023-12-29 DIAGNOSIS — R809 Proteinuria, unspecified: Secondary | ICD-10-CM

## 2023-12-29 MED ORDER — METFORMIN HCL ER 500 MG PO TB24
1000.0000 mg | ORAL_TABLET | Freq: Two times a day (BID) | ORAL | 0 refills | Status: DC
  Filled 2023-12-29 (×2): qty 120, 30d supply, fill #0

## 2023-12-31 ENCOUNTER — Other Ambulatory Visit: Payer: Self-pay

## 2024-01-05 ENCOUNTER — Other Ambulatory Visit: Payer: Self-pay

## 2024-01-05 ENCOUNTER — Telehealth: Payer: Self-pay | Admitting: Internal Medicine

## 2024-01-05 NOTE — Telephone Encounter (Signed)
 Confirmed appt for 8/21

## 2024-01-06 ENCOUNTER — Ambulatory Visit: Payer: Self-pay | Attending: Internal Medicine | Admitting: Internal Medicine

## 2024-01-06 ENCOUNTER — Encounter: Payer: Self-pay | Admitting: Internal Medicine

## 2024-01-06 ENCOUNTER — Other Ambulatory Visit: Payer: Self-pay

## 2024-01-06 VITALS — BP 130/60 | HR 61 | Temp 98.5°F | Ht 61.0 in | Wt 139.0 lb

## 2024-01-06 DIAGNOSIS — E785 Hyperlipidemia, unspecified: Secondary | ICD-10-CM

## 2024-01-06 DIAGNOSIS — R809 Proteinuria, unspecified: Secondary | ICD-10-CM

## 2024-01-06 DIAGNOSIS — E119 Type 2 diabetes mellitus without complications: Secondary | ICD-10-CM

## 2024-01-06 DIAGNOSIS — E1159 Type 2 diabetes mellitus with other circulatory complications: Secondary | ICD-10-CM

## 2024-01-06 DIAGNOSIS — Z7984 Long term (current) use of oral hypoglycemic drugs: Secondary | ICD-10-CM

## 2024-01-06 DIAGNOSIS — B20 Human immunodeficiency virus [HIV] disease: Secondary | ICD-10-CM

## 2024-01-06 DIAGNOSIS — Z1211 Encounter for screening for malignant neoplasm of colon: Secondary | ICD-10-CM

## 2024-01-06 DIAGNOSIS — I10 Essential (primary) hypertension: Secondary | ICD-10-CM

## 2024-01-06 DIAGNOSIS — E039 Hypothyroidism, unspecified: Secondary | ICD-10-CM

## 2024-01-06 DIAGNOSIS — E1169 Type 2 diabetes mellitus with other specified complication: Secondary | ICD-10-CM

## 2024-01-06 LAB — GLUCOSE, POCT (MANUAL RESULT ENTRY): POC Glucose: 149 mg/dL — AB (ref 70–99)

## 2024-01-06 LAB — POCT GLYCOSYLATED HEMOGLOBIN (HGB A1C): HbA1c, POC (controlled diabetic range): 10.6 % — AB (ref 0.0–7.0)

## 2024-01-06 MED ORDER — PEN NEEDLES 31G X 8 MM MISC
6 refills | Status: AC
  Filled 2024-01-06: qty 100, 100d supply, fill #0
  Filled 2024-05-03: qty 100, 100d supply, fill #1

## 2024-01-06 MED ORDER — METFORMIN HCL ER 500 MG PO TB24
1000.0000 mg | ORAL_TABLET | Freq: Two times a day (BID) | ORAL | 1 refills | Status: DC
Start: 2024-01-06 — End: 2024-03-24
  Filled 2024-01-06 – 2024-02-18 (×2): qty 180, 45d supply, fill #0

## 2024-01-06 MED ORDER — TRUE METRIX BLOOD GLUCOSE TEST VI STRP
ORAL_STRIP | 3 refills | Status: AC
  Filled 2024-01-06: qty 100, 100d supply, fill #0
  Filled 2024-05-03: qty 100, 100d supply, fill #1

## 2024-01-06 MED ORDER — GLIMEPIRIDE 4 MG PO TABS
4.0000 mg | ORAL_TABLET | Freq: Two times a day (BID) | ORAL | 1 refills | Status: DC
  Filled 2024-01-06: qty 180, 90d supply, fill #0

## 2024-01-06 MED ORDER — LEVOTHYROXINE SODIUM 50 MCG PO TABS
75.0000 ug | ORAL_TABLET | Freq: Every day | ORAL | 1 refills | Status: AC
  Filled 2024-01-06 – 2024-04-10 (×2): qty 135, 90d supply, fill #0

## 2024-01-06 MED ORDER — LISINOPRIL 30 MG PO TABS
30.0000 mg | ORAL_TABLET | Freq: Every day | ORAL | 3 refills | Status: AC
  Filled 2024-01-06 – 2024-04-10 (×2): qty 90, 90d supply, fill #0

## 2024-01-06 MED ORDER — LANTUS SOLOSTAR 100 UNIT/ML ~~LOC~~ SOPN
8.0000 [IU] | PEN_INJECTOR | Freq: Every day | SUBCUTANEOUS | 11 refills | Status: DC
  Filled 2024-01-06: qty 3, 28d supply, fill #0
  Filled 2024-01-26: qty 3, 28d supply, fill #1

## 2024-01-06 NOTE — Progress Notes (Signed)
 Patient ID: Manuel Hayes, male    DOB: Mar 13, 1971  MRN: [id number]  CC: Diabetes (DM f/u. Med refill./No questions / concerns/Yes to pneumonia vax)   Subjective: Manuel Hayes is a 53 y.o. male who presents for chronic ds management. 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     AMN Language interpreter used during this encounter. #Bladimir, [id number]. Doyal [id number]  Discussed the use of AI scribe software for clinical note transcription with the patient, who gave verbal consent to proceed.  History of Present Illness Manuel Hayes is a 53 year old male with diabetes, hypertension, and hyperlipidemia who presents for follow-up.  DM: Results for orders placed or performed in visit on 01/06/24  POCT glucose (manual entry)   Collection Time: 01/06/24 11:17 AM  Result Value Ref Range   POC Glucose 149 (A) 70 - 99 mg/dl  POCT glycosylated hemoglobin (Hb A1C)   Collection Time: 01/06/24 11:22 AM  Result Value Ref Range   Hemoglobin A1C     HbA1c POC (<> result, manual entry)     HbA1c, POC (prediabetic range)     HbA1c, POC (controlled diabetic range) 10.6 (A) 0.0 - 7.0 %  He is managing his diabetes with metformin  500 mg, two tablets in the morning and two in the evening, and glimepiride  4mg , one tablet in the morning and one in the evening. His A1c is 10.6. He checks his blood sugar three times a day, usually before meals, with morning readings at 128 mg/dL and typical levels around 160-180 mg/dL. He has about two days' worth of metformin  left. He avoids sugary drinks and snacks and engages in physical activity by walking and lifting small weights, sometimes every other day before work. Patient with history of macroalbumin. He is on Lisinopril   HTN: For hypertension, he is on lisinopril  30 mg daily. He does not have a device at home to check  his blood pressure but does not consume salt. No chest pain, shortness of breath, headaches, or dizziness.  Regarding hyperlipidemia, he is taking atorvastatin  40 mg daily, which was adjusted previously.  He has a history of HIV and is taking Davato and Bactrim  consistently.  He is also on levothyroxine  75mcg for thyroid  management, which he takes daily TSH in January was normal at 4.14.    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   HIV disease (HCC) 02/08/2020   Lung nodules 01/04/2020   Pruritic rash 03/16/2018   Anemia 12/27/2017   Hyperproteinemia 07/05/2017   Hypoalbuminemia 07/05/2017   Positive for macroalbuminuria 11/21/2016   Elevated liver enzymes 08/21/2016   Benign essential hypertension 08/21/2016   Hyperlipidemia associated with type 2 diabetes mellitus (HCC) 08/21/2016   Type 2 diabetes mellitus (HCC) 08/21/2016   Blurring of visual image 08/21/2016     Current Outpatient Medications on File Prior to Visit  Medication Sig Dispense Refill   Alcohol  Swabs  PADS Clean fingers/equipments prior to CBG testing of your fingers 100 each 11   atorvastatin  (LIPITOR) 40 MG tablet Take 1 tablet (40 mg total) by mouth daily. 30 tablet 11   Blood Glucose Monitoring Suppl (TRUE METRIX METER) w/Device KIT Use to check  blood sugar once daily. 1 kit 0   dolutegravir -lamiVUDine  (DOVATO ) 50-300 MG tablet Take 1 tablet by mouth daily. 30 tablet 11   Omega-3 Fatty Acids (FISH OIL PO) Take by mouth.     TRUEplus Lancets 28G MISC Use to check blood sugar once daily. 100 each 3   gabapentin  (NEURONTIN ) 100 MG capsule Take 1 capsule (100 mg total) by mouth 3 (three) times daily. (Patient not taking: Reported on 01/06/2024) 90 capsule 2    hydrocortisone (ANUSOL-HC) 2.5 % rectal cream  (Patient not taking: Reported on 01/06/2024)     sulfamethoxazole -trimethoprim  (BACTRIM ) 400-80 MG tablet Take 1 tablet by mouth 2 (two) times daily. (Patient not taking: Reported on 01/06/2024) 60 tablet 11   No current facility-administered medications on file prior to visit.    No Known Allergies  Social History   Socioeconomic History   Marital status: Single    Spouse name: Not on file   Number of children: Not on file   Years of education: Not on file   Highest education level: Not on file  Occupational History   Not on file  Tobacco Use   Smoking status: Never   Smokeless tobacco: Never  Substance and Sexual Activity   Alcohol  use: Not Currently   Drug use: Never   Sexual activity: Yes    Partners: Female    Comment: accepted condoms  Other Topics Concern   Not on file  Social History Narrative   Not on file   Social Drivers of Health   Financial Resource Strain: High Risk (05/24/2023)   Overall Financial Resource Strain (CARDIA)    Difficulty of Paying Living Expenses: Very hard  Food Insecurity: Food Insecurity Present (05/24/2023)   Hunger Vital Sign    Worried About Running Out of Food in the Last Year: Sometimes true    Ran Out of Food in the Last Year: Never true  Transportation Needs: No Transportation Needs (05/24/2023)   PRAPARE - Administrator, Civil Service (Medical): No    Lack of Transportation (Non-Medical): No  Physical Activity: Insufficiently Active (05/24/2023)   Exercise Vital Sign    Days of Exercise per Week: 3 days    Minutes of Exercise per Session: 30 min  Stress: Stress Concern Present (05/24/2023)   [employer] of Occupational Health - Occupational Stress Questionnaire    Feeling of Stress : To some extent  Social Connections: Moderately Integrated (05/24/2023)   Social Connection and Isolation Panel    Frequency of Communication with Friends and Family: More than three times a  week    Frequency of Social Gatherings with Friends and Family: Three times a week    Attends Religious Services: More than 4 times per year    Active Member of Clubs or Organizations: Yes    Attends Banker Meetings: More than 4 times per year    Marital Status: Separated  Intimate Partner Violence: Not At Risk (05/24/2023)   Humiliation, Afraid, Rape, and Kick questionnaire    Fear of Current or Ex-Partner: No    Emotionally Abused: No    Physically Abused: No    Sexually Abused: No    Family History  Problem Relation Age of Onset   Diabetes Mother    Diabetes Brother     No past surgical history on file.  ROS: Review of Systems Negative except as stated above  PHYSICAL EXAM: BP 130/60   Pulse 61   Temp 98.5 F (36.9 C) (Oral)  Ht 5' 1 (1.549 m)   Wt 139 lb (63 kg)   SpO2 99%   BMI 26.26 kg/m   Physical Exam   General appearance - alert, well appearing, and in no distress Mental status - normal mood, behavior, speech, dress, motor activity, and thought processes Neck - supple, no significant adenopathy Chest - clear to auscultation, no wheezes, rales or rhonchi, symmetric air entry Heart - normal rate, regular rhythm, normal S1, S2, no murmurs, rubs, clicks or gallops Extremities - peripheral pulses normal, no pedal edema, no clubbing or cyanosis     Latest Ref Rng & Units 05/31/2023    8:42 AM 08/10/2022    9:05 AM 01/16/2022    9:34 AM  CMP  Glucose 70 - 99 mg/dL 831  832  811   BUN 6 - 24 mg/dL 24  23  19    Creatinine 0.76 - 1.27 mg/dL 8.90  8.80  8.94   Sodium 134 - 144 mmol/L 139  133  137   Potassium 3.5 - 5.2 mmol/L 4.5  4.1  3.9   Chloride 96 - 106 mmol/L 104  100  102   CO2 20 - 29 mmol/L 24  26  28    Calcium  8.7 - 10.2 mg/dL 8.7  8.8  9.0   Total Protein 6.0 - 8.5 g/dL 6.8  7.2  7.3   Total Bilirubin 0.0 - 1.2 mg/dL 0.3  0.4  0.5   Alkaline Phos 44 - 121 IU/L 149     AST 0 - 40 IU/L 14  21  22    ALT 0 - 44 IU/L 15  19  20     Lipid  Panel     Component Value Date/Time   CHOL 128 05/31/2023 0842   TRIG 184 (H) 05/31/2023 0842   HDL 37 (L) 05/31/2023 0842   CHOLHDL 3.5 05/31/2023 0842   CHOLHDL 3.7 01/16/2022 0934   LDLCALC 60 05/31/2023 0842   LDLCALC 85 01/16/2022 0934    CBC    Component Value Date/Time   WBC 5.4 05/31/2023 0842   WBC 4.6 08/10/2022 0905   RBC 4.31 05/31/2023 0842   RBC 4.46 08/10/2022 0905   HGB 14.0 05/31/2023 0842   HCT 42.1 05/31/2023 0842   PLT 136 (L) 05/31/2023 0842   MCV 98 (H) 05/31/2023 0842   MCH 32.5 05/31/2023 0842   MCH 32.1 08/10/2022 0905   MCHC 33.3 05/31/2023 0842   MCHC 33.6 08/10/2022 0905   RDW 11.4 (L) 05/31/2023 0842   LYMPHSABS 1,130 01/16/2022 0934   MONOABS 0.9 11/20/2019 0246   EOSABS 50 01/16/2022 0934   BASOSABS 18 01/16/2022 0934    ASSESSMENT AND PLAN: 1. Type 2 diabetes mellitus with other specified complication, without long-term current use of insulin  (HCC) (Primary) A1c has improved from 4 months ago but still not at goal.  Patient has been reluctant to be placed on insulin  or GLP-1 agonist on past visits but is agreeable today to being started on insulin . -I recommend starting Lantus  insulin  8 units at bedtime.  Continue metformin  1 g twice a day and Amaryl  4 mg twice a day.  Went over signs and symptoms of hypoglycemia and how to treat.  Advised that if blood sugars start dropping below 80, he should decrease the Amaryl  to 4 mg once a day.  He expressed understanding. -Clinical pharmacist to meet with patient today to show insulin  administration -I requested that he follow-up with the clinical pharmacist in 1 month with blood sugar  readings for recheck. - glucose blood (TRUE METRIX BLOOD GLUCOSE TEST) test strip; Use to check blood sugar once daily.  Dispense: 100 each; Refill: 3 - glimepiride  (AMARYL ) 4 MG tablet; Take 1 tablet (4 mg total) by mouth in the morning and at bedtime.  Dispense: 180 tablet; Refill: 1 - metFORMIN  (GLUCOPHAGE -XR) 500 MG  24 hr tablet; Take 2 tablets (1,000 mg total) by mouth 2 (two) times daily with a meal.  Dispense: 180 tablet; Refill: 1 - insulin  glargine (LANTUS  SOLOSTAR) 100 UNIT/ML Solostar Pen; Inject 8 Units into the skin at bedtime.  Dispense: 15 mL; Refill: 11 - Insulin  Pen Needle (PEN NEEDLES) 31G X 8 MM MISC; Use with Lantus   Dispense: 100 each; Refill: 6  2. Diabetes mellitus treated with oral medication (HCC) See #1 above - POCT glycosylated hemoglobin (Hb A1C) - POCT glucose (manual entry)  3. Positive for macroalbuminuria Continue lisinopril   4. Hypertension associated with diabetes (HCC) At goal.  Continue lisinopril  30 mg daily - lisinopril  (ZESTRIL ) 30 MG tablet; Take 1 tablet (30 mg total) by mouth daily.  Dispense: 90 tablet; Refill: 3  5. Hyperlipidemia associated with type 2 diabetes mellitus (HCC) LDL at goal.  Continue atorvastatin  40 mg daily  6. Acquired hypothyroidism Continue current dose of levothyroxine  - levothyroxine  (SYNTHROID ) 50 MCG tablet; Take 1.5 tablets (75 mcg total) by mouth daily before breakfast.  Dispense: 135 tablet; Refill: 1  7. HIV disease (HCC) Asymptomatic. Followed regularly by ID and reports compliance with meds  8. Screening for colon cancer - Fecal occult blood, imunochemical([facility])  Patient was given the opportunity to ask questions.  Patient verbalized understanding of the plan and was able to repeat key elements of the plan.   This documentation was completed using Paediatric nurse.  Any transcriptional errors are unintentional.  Orders Placed This Encounter  Procedures   Fecal occult blood, imunochemical([facility])   POCT glycosylated hemoglobin (Hb A1C)   POCT glucose (manual entry)     Requested Prescriptions   Signed Prescriptions Disp Refills   glucose blood (TRUE METRIX BLOOD GLUCOSE TEST) test strip 100 each 3    Sig: Use to check blood sugar once daily.   glimepiride  (AMARYL ) 4 MG  tablet 180 tablet 1    Sig: Take 1 tablet (4 mg total) by mouth in the morning and at bedtime.   levothyroxine  (SYNTHROID ) 50 MCG tablet 135 tablet 1    Sig: Take 1.5 tablets (75 mcg total) by mouth daily before breakfast.   metFORMIN  (GLUCOPHAGE -XR) 500 MG 24 hr tablet 180 tablet 1    Sig: Take 2 tablets (1,000 mg total) by mouth 2 (two) times daily with a meal.   lisinopril  (ZESTRIL ) 30 MG tablet 90 tablet 3    Sig: Take 1 tablet (30 mg total) by mouth daily.   insulin  glargine (LANTUS  SOLOSTAR) 100 UNIT/ML Solostar Pen 15 mL 11    Sig: Inject 8 Units into the skin at bedtime.   Insulin  Pen Needle (PEN NEEDLES) 31G X 8 MM MISC 100 each 6    Sig: Use with Lantus     Return in about 4 months (around 05/07/2024) for 4 weeks with clinical pharmacist for DM.  Barnie Louder, MD, FACP

## 2024-01-06 NOTE — Patient Instructions (Signed)
 VISIT SUMMARY:  Today, we reviewed your diabetes, hypertension, hyperlipidemia, HIV, and hypothyroidism management. We discussed your current medications and made some adjustments to better control your conditions. We also provided a stool kit for colon cancer screening.  YOUR PLAN:  -TYPE 2 DIABETES MELLITUS, POORLY CONTROLLED: Your blood sugar levels are higher than desired, indicating that your diabetes is not well controlled. We will start you on insulin  therapy with 8 units daily at bedtime, in addition to your current medications, metformin  and glimepiride . If your blood sugar drops below 80 mg/dL, reduce glimepiride  to once daily. A clinical pharmacist will show you how to use insulin , and you should be aware of the symptoms of low blood sugar and how to respond.  -ESSENTIAL HYPERTENSION: Your blood pressure is slightly elevated. Continue taking lisinopril  30 mg daily and try to limit your salt intake. We recommend getting a home blood pressure monitor to keep track of your readings.  -HYPERLIPIDEMIA: Your cholesterol levels are being managed with atorvastatin  40 mg daily. Continue taking this medication as prescribed.  -HUMAN IMMUNODEFICIENCY VIRUS (HIV) INFECTION: Your HIV is being managed with VADL and Bactrim . Continue taking these medications as prescribed.  -HYPOTHYROIDISM: Your thyroid  condition is being managed with levothyroxine . Continue taking this medication daily.  -GENERAL HEALTH MAINTENANCE: You are due for colon cancer screening. We have provided you with a stool kit for this purpose.  INSTRUCTIONS:  Please follow up with the clinical pharmacist for insulin  administration training. Use the stool kit provided for colon cancer screening. Consider purchasing a home blood pressure monitor to keep track of your blood pressure readings.

## 2024-01-07 ENCOUNTER — Telehealth: Payer: Self-pay | Admitting: Internal Medicine

## 2024-01-07 ENCOUNTER — Other Ambulatory Visit: Payer: Self-pay

## 2024-01-07 NOTE — Telephone Encounter (Signed)
 Copied from CRM [id number]. Topic: Clinical - Medication Question >> Jan 06, 2024  1:07 PM Fonda T wrote: Reason for CRM: [employer] assisted, [place], PI#[id number].  Received call from patient, states he was intructed to go to pharmacy for a nurse/pharmacy tech to advise how to use insuiln.   Patient reports he is needeing assitance on how to use insulin  as prescribed, as no one was available to assist.  Patient requesting a return call for further directives.  Can be reached at [phone] to assist further.

## 2024-01-10 NOTE — Telephone Encounter (Signed)
 Please place patient on luke schedule for insulin  teaching.

## 2024-01-14 ENCOUNTER — Ambulatory Visit: Payer: Self-pay | Admitting: Internal Medicine

## 2024-01-14 LAB — FECAL OCCULT BLOOD, IMMUNOCHEMICAL: Fecal Occult Bld: NEGATIVE

## 2024-01-26 ENCOUNTER — Other Ambulatory Visit: Payer: Self-pay

## 2024-02-16 ENCOUNTER — Telehealth: Payer: Self-pay | Admitting: Internal Medicine

## 2024-02-16 NOTE — Telephone Encounter (Signed)
 Confirmed appt for 10/3

## 2024-02-18 ENCOUNTER — Ambulatory Visit: Payer: Self-pay | Attending: Internal Medicine | Admitting: Pharmacist

## 2024-02-18 ENCOUNTER — Other Ambulatory Visit: Payer: Self-pay

## 2024-02-18 ENCOUNTER — Encounter: Payer: Self-pay | Admitting: Pharmacist

## 2024-02-18 DIAGNOSIS — Z794 Long term (current) use of insulin: Secondary | ICD-10-CM

## 2024-02-18 DIAGNOSIS — E1169 Type 2 diabetes mellitus with other specified complication: Secondary | ICD-10-CM

## 2024-02-18 DIAGNOSIS — Z7984 Long term (current) use of oral hypoglycemic drugs: Secondary | ICD-10-CM

## 2024-02-18 MED ORDER — LANTUS SOLOSTAR 100 UNIT/ML ~~LOC~~ SOPN
6.0000 [IU] | PEN_INJECTOR | Freq: Every day | SUBCUTANEOUS | 11 refills | Status: DC
  Filled 2024-02-18: qty 3, 28d supply, fill #0
  Filled 2024-03-17: qty 3, 28d supply, fill #1

## 2024-02-18 NOTE — Progress Notes (Signed)
 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     Patient was referred and last seen by Primary Care Provider, Dr. Vicci, on 01/06/2024. At that visit, A1c was 10.6%. At that visit, his PO therapy was continued and insulin  was added.  Today, patient arrives in good spirits and presents with the assistance of an online interpretor. AMN interpreters: PI#[id number], Sheree. Pt reports stopping metformin . Even though Drl. Vicci told him to continue this and stop glimepiride  if needed, he stopped the metformin  and has been taking glimepiride  BID and Lantus . Furthermore, he tells me he is only injecting insulin  when his fasting blood sugar levels at home are >160 mg/dL in the morning. When this occurs, he injects 10 units of Lantus . He does not have any symptomatic hyperglycemia at this time. Tells me his pain in hands and feet have improved since starting insulin . He is not having any symptoms of hypoglycemia.   Family/Social History:  Fhx: DM Tobacco: never smoker Alcohol : none reported   Current diabetes medications include: glimepiride  4 mg BID, metformin  500 mg XR (not taking), Lantus  10 units daily (takes if fasting CBG is >160 mg/dL; usually 3x weekly) Patient reports adherence to taking all medications as prescribed.   Insurance coverage: none  Patient denies hypoglycemic events.  Patient denies polyuria, polydipsia. Patient denies neuropathy (nerve pain). Patient denies visual changes. Patient reports self foot exams.   Patient reported dietary habits: Eats 3 meals/day -Tells me he eats lots of beans, lentils, vegetables,  -Drinks: no sugar-sweetened beverages, denies any intake of dessert  Patient-reported exercise habits:  -Active at his job -Some weight lifting,  running, walking  O:  No GM with him today or CGM in place.  Reports seeing numbers in the 150s - 210s.  Tells me the higher numbers occur later in the day.   Lab Results  Component Value Date   HGBA1C 10.6 (A) 01/06/2024   There were no vitals filed for this visit.  Lipid Panel     Component Value Date/Time   CHOL 128 05/31/2023 0842   TRIG 184 (H) 05/31/2023 0842   HDL 37 (L) 05/31/2023 0842   CHOLHDL 3.5 05/31/2023 0842   CHOLHDL 3.7 01/16/2022 0934   LDLCALC 60 05/31/2023 0842   LDLCALC 85 01/16/2022 0934    Clinical Atherosclerotic Cardiovascular Disease (ASCVD): No  The ASCVD Risk score (Arnett DK, et al., 2019) failed to calculate for the following reasons:   The valid total cholesterol range is 130 to 320 mg/dL   A/P: Diabetes longstanding currently uncontrolled. Home sugar log reveals improvement since starting insulin . I have discussed metformin , glimepiride , and Lantus  with him. To idealize his regimen, we will have him stop glimepiride  and start metformin  500 mg XR. I have instructed him to take 1000 mg (2 tablets) BID daily. He verifies understanding of his instructions, and I had him teach back the plan regarding metformin  and glimepiride  twice. Regarding his insulin , I encouraged daily use. We will decrease him to 6 units daily. I have instructed him to inject this daily, even if his fasting sugar is below 160 mg/dL. I told him to skip his insulin  ONLY if his fasting CBG is <70 mg/dL. He again verified understanding of his instructions using teach back twice. Patient is able to verbalize  appropriate hypoglycemia management plan. -Restart metformin  500 mg XR - take 2 tablets (1000 mg) BID. -Stop glimepiride .  -Take Lantus  6 units daily in the morning EVERY DAY. SKIP ONLY IF CBG, FASTING, IS <70 mg/dL. -Patient educated on purpose, proper use, and potential adverse effects of metformin , Lantus , and glimepiride .  -Extensively discussed pathophysiology of diabetes,  recommended lifestyle interventions, dietary effects on blood sugar control.  -Counseled on s/sx of and management of hypoglycemia.  -Next A1c 03/2024.  Written patient instructions provided. Patient verbalized understanding of treatment plan.  Total time in face to face counseling 30 minutes.    Follow-up:  Pharmacist in 1 month.  Herlene Fleeta Morris, PharmD, JAQUELINE, CPP Clinical Pharmacist [facility] [phone]

## 2024-03-17 ENCOUNTER — Other Ambulatory Visit: Payer: Self-pay

## 2024-03-24 ENCOUNTER — Ambulatory Visit: Payer: Self-pay | Attending: Internal Medicine | Admitting: Pharmacist

## 2024-03-24 ENCOUNTER — Encounter: Payer: Self-pay | Admitting: Pharmacist

## 2024-03-24 ENCOUNTER — Other Ambulatory Visit: Payer: Self-pay

## 2024-03-24 DIAGNOSIS — Z7984 Long term (current) use of oral hypoglycemic drugs: Secondary | ICD-10-CM

## 2024-03-24 DIAGNOSIS — Z794 Long term (current) use of insulin: Secondary | ICD-10-CM

## 2024-03-24 DIAGNOSIS — E119 Type 2 diabetes mellitus without complications: Secondary | ICD-10-CM

## 2024-03-24 DIAGNOSIS — E1169 Type 2 diabetes mellitus with other specified complication: Secondary | ICD-10-CM

## 2024-03-24 LAB — POCT GLYCOSYLATED HEMOGLOBIN (HGB A1C): HbA1c, POC (controlled diabetic range): 10.5 % — AB (ref 0.0–7.0)

## 2024-03-24 MED ORDER — LANTUS SOLOSTAR 100 UNIT/ML ~~LOC~~ SOPN
10.0000 [IU] | PEN_INJECTOR | Freq: Every day | SUBCUTANEOUS | 2 refills | Status: AC
  Filled 2024-03-24 – 2024-03-31 (×2): qty 3, 30d supply, fill #0
  Filled 2024-05-17: qty 3, 30d supply, fill #1
  Filled 2024-06-14: qty 3, 30d supply, fill #2

## 2024-03-24 MED ORDER — METFORMIN HCL ER 500 MG PO TB24
1000.0000 mg | ORAL_TABLET | Freq: Two times a day (BID) | ORAL | 1 refills | Status: AC
  Filled 2024-03-24 (×2): qty 360, 90d supply, fill #0

## 2024-03-24 NOTE — Progress Notes (Signed)
 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     Patient was referred and last seen by Primary Care Provider, Dr. Vicci, on 01/06/2024. At that visit, A1c was 10.6%.   I saw him on 02/18/2024. At that visit, he reported that he stopped metformin  and continued the combination of glimepiride  and Lantus  even though he was instructed by Dr. Vicci to stop glimepiride  and continue the combination of metformin  and Lantus . In addition, he admitted to only taking 10 units of insulin  on days when his fasting blood sugar at home was above 160 mg/dL. I had him restart metformin , stop glimepiride , and adjusted insulin  to take as 6 units daily. He was instructed to skip his dose ONLY if fasting sugars were below 70 mg/dL.   Today, patient arrives in good spirits and presents with the assistance of an online interpretor. AMN interpreters: PI#[id number], Wilbert.   Pt reports that he restarted the metformin  as instructed. He is taking 2 tablets BID (1000mg  BID) as instructed. He has been taking this and Lantus  as prescribed. Tells me he injects 8 units daily and has not skipped any doses. His A1c today is unchanged from prior at 10.5% (10.6% in August). However, I believe this result to be influenced primarily by his sugars in August and September. It was last month before I was able to identify and correct his adherence issues. He does not have any symptomatic hyperglycemia at this time. Tells me his pain in hands and feet have improved since starting insulin . He is not having any symptoms of hypoglycemia.   Family/Social History:  Fhx: DM Tobacco: never smoker Alcohol : none reported   Current diabetes medications include: metformin  500 mg XR (takes 2 tablets, 1000mg  total, BID), Lantus  8 units  daily Patient reports adherence to taking all medications as prescribed.   Insurance coverage: none  Patient denies hypoglycemic events.  Patient denies polyuria, polydipsia. Patient denies neuropathy (nerve pain). Patient denies visual changes. Patient reports self foot exams.   Patient reported dietary habits: Eats 3 meals/day -Tells me he eats lots of beans, lentils, vegetables,  -Drinks: no sugar-sweetened beverages, denies any intake of dessert  Patient-reported exercise habits:  -Active at his job -Some weight lifting, running, walking  O:  No GM with him today or CGM in place.  -Fasting: 120s. Highest is 125 mg/dL. -Post-prandial/random: 160-180s mg/dL.  Lab Results  Component Value Date   HGBA1C 10.6 (A) 01/06/2024   There were no vitals filed for this visit.  Lipid Panel     Component Value Date/Time   CHOL 128 05/31/2023 0842   TRIG 184 (H) 05/31/2023 0842   HDL 37 (L) 05/31/2023 0842   CHOLHDL 3.5 05/31/2023 0842   CHOLHDL 3.7 01/16/2022 0934   LDLCALC 60 05/31/2023 0842   LDLCALC 85 01/16/2022 0934    Clinical Atherosclerotic Cardiovascular Disease (ASCVD): No  The ASCVD Risk score (Arnett DK, et al., 2019) failed to calculate for the following reasons:   The valid total cholesterol range is 130 to 320 mg/dL   A/P: Diabetes longstanding currently uncontrolled. Reported sugars are at goal. Again, I think his A1c today is heavily influenced by sugars in August and September when he was not taking his medications correctly. He verified understanding of his instructions using teach back  twice. I will have him increase Lantus  today and continue metformin  as is. His sugars are reported - I was unable to truly verify what he's been running at home. Therefore, I will hold off on more aggressive changes. I have instructed him to bring his meter when he sees Dr. Vicci later in December and also when he sees me again in Jan. Patient is able to verbalize appropriate  hypoglycemia management plan. -CONTINUE metformin  500 mg XR - take 2 tablets (1000 mg) BID. -INCREASE Lantus  TO 10 units daily in the morning EVERY DAY. -Patient educated on purpose, proper use, and potential adverse effects of metformin , Lantus .  -Extensively discussed pathophysiology of diabetes, recommended lifestyle interventions, dietary effects on blood sugar control.  -Counseled on s/sx of and management of hypoglycemia.  -Next A1c 06/2024.  Written patient instructions provided. Patient verbalized understanding of treatment plan.  Total time in face to face counseling 30 minutes.    Follow-up:  Pharmacist in 2 months. PCP: 05/15/2024  Herlene Fleeta Morris, PharmD, BCACP, CPP Clinical Pharmacist [facility] [phone]

## 2024-03-31 ENCOUNTER — Other Ambulatory Visit: Payer: Self-pay

## 2024-04-10 ENCOUNTER — Other Ambulatory Visit: Payer: Self-pay

## 2024-04-11 ENCOUNTER — Other Ambulatory Visit: Payer: Self-pay

## 2024-04-12 ENCOUNTER — Other Ambulatory Visit: Payer: Self-pay

## 2024-04-18 ENCOUNTER — Telehealth: Payer: Self-pay

## 2024-04-18 ENCOUNTER — Other Ambulatory Visit (HOSPITAL_COMMUNITY): Payer: Self-pay

## 2024-04-18 NOTE — Telephone Encounter (Signed)
 error

## 2024-05-03 ENCOUNTER — Other Ambulatory Visit: Payer: Self-pay

## 2024-05-15 ENCOUNTER — Ambulatory Visit: Payer: Self-pay | Admitting: Internal Medicine

## 2024-05-17 ENCOUNTER — Other Ambulatory Visit: Payer: Self-pay

## 2024-06-14 ENCOUNTER — Other Ambulatory Visit: Payer: Self-pay

## 2024-06-22 ENCOUNTER — Ambulatory Visit: Payer: Self-pay | Admitting: Internal Medicine

## 2024-08-04 ENCOUNTER — Ambulatory Visit: Payer: Self-pay | Admitting: Internal Medicine

## 2024-08-10 ENCOUNTER — Ambulatory Visit: Payer: Self-pay | Admitting: Internal Medicine
